# Patient Record
Sex: Female | Born: 1975
Health system: Southern US, Community
[De-identification: ages and names within clinical notes are randomized; demographics above are authoritative.]

## PROBLEM LIST (undated history)

## (undated) DIAGNOSIS — R102 Pelvic and perineal pain unspecified side: Secondary | ICD-10-CM

## (undated) DIAGNOSIS — R1031 Right lower quadrant pain: Secondary | ICD-10-CM

## (undated) DIAGNOSIS — N809 Endometriosis, unspecified: Secondary | ICD-10-CM

## (undated) DIAGNOSIS — F419 Anxiety disorder, unspecified: Secondary | ICD-10-CM

## (undated) DIAGNOSIS — T7840XA Allergy, unspecified, initial encounter: Secondary | ICD-10-CM

## (undated) DIAGNOSIS — N83209 Unspecified ovarian cyst, unspecified side: Secondary | ICD-10-CM

## (undated) DIAGNOSIS — K5909 Other constipation: Secondary | ICD-10-CM

## (undated) DIAGNOSIS — N6019 Diffuse cystic mastopathy of unspecified breast: Secondary | ICD-10-CM

## (undated) HISTORY — DX: Endometriosis, unspecified: N80.9

## (undated) HISTORY — PX: WISDOM TOOTH EXTRACTION: SHX21

## (undated) HISTORY — DX: Allergy, unspecified, initial encounter: T78.40XA

## (undated) HISTORY — PX: TONSILLECTOMY AND ADENOIDECTOMY: SUR1326

## (undated) HISTORY — DX: Other constipation: K59.09

## (undated) HISTORY — DX: Pelvic and perineal pain unspecified side: R10.20

## (undated) HISTORY — DX: Right lower quadrant pain: R10.31

## (undated) HISTORY — DX: Unspecified ovarian cyst, unspecified side: N83.209

## (undated) HISTORY — DX: Anxiety disorder, unspecified: F41.9

## (undated) HISTORY — DX: Pelvic and perineal pain: R10.2

---

## 2006-09-04 ENCOUNTER — Ambulatory Visit: Payer: Self-pay | Admitting: Surgery

## 2007-03-21 ENCOUNTER — Other Ambulatory Visit: Payer: Self-pay

## 2007-03-22 ENCOUNTER — Ambulatory Visit: Payer: Self-pay | Admitting: Emergency Medicine

## 2007-09-06 ENCOUNTER — Observation Stay: Payer: Self-pay

## 2007-10-18 ENCOUNTER — Emergency Department: Payer: Self-pay | Admitting: Emergency Medicine

## 2007-12-13 ENCOUNTER — Inpatient Hospital Stay: Payer: Self-pay | Admitting: Obstetrics and Gynecology

## 2010-05-31 ENCOUNTER — Ambulatory Visit: Payer: Self-pay

## 2011-01-17 ENCOUNTER — Other Ambulatory Visit: Payer: Self-pay | Admitting: Physician Assistant

## 2011-01-18 ENCOUNTER — Inpatient Hospital Stay: Payer: Self-pay | Admitting: Internal Medicine

## 2011-01-21 ENCOUNTER — Other Ambulatory Visit: Payer: Self-pay | Admitting: Physician Assistant

## 2011-01-25 ENCOUNTER — Other Ambulatory Visit: Payer: Self-pay | Admitting: Physician Assistant

## 2011-08-19 ENCOUNTER — Encounter: Payer: Self-pay | Admitting: Obstetrics and Gynecology

## 2011-09-30 ENCOUNTER — Encounter: Payer: Self-pay | Admitting: Maternal & Fetal Medicine

## 2011-11-12 ENCOUNTER — Encounter: Payer: Self-pay | Admitting: Cardiovascular Disease

## 2011-11-14 ENCOUNTER — Telehealth: Payer: Self-pay

## 2011-11-14 NOTE — Telephone Encounter (Signed)
Dr. Mariah Milling called pt at home to discuss palpitations.  She is 6 months pregnant being referred by OBGYN for c/o palpitations.  She told Dr. Mariah Milling she had these same palpitations with her last pregnancy and was caught on EKG. This was shown to be an atrial tachycardia. The episodes she is having now only last 10 mins at a time and do not occur frequently. The issue is if we prescribe a beta blocker, by the time she takes the med, the episode is over.  Dr. 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 Dr. Mariah Milling offered event/holter monitor but she would rather continue to monitor events and try to catch on an EKG. One option may be propanolol in the future if these episodes continue.  She will keep [place] updated on her situation.

## 2011-11-15 ENCOUNTER — Ambulatory Visit: Payer: Self-pay | Admitting: Cardiovascular Disease

## 2011-11-25 ENCOUNTER — Ambulatory Visit (INDEPENDENT_AMBULATORY_CARE_PROVIDER_SITE_OTHER): Payer: No Typology Code available for payment source

## 2011-11-25 VITALS — HR 101

## 2011-11-25 DIAGNOSIS — I4719 Other supraventricular tachycardia: Secondary | ICD-10-CM | POA: Insufficient documentation

## 2011-11-25 DIAGNOSIS — R002 Palpitations: Secondary | ICD-10-CM

## 2011-11-25 DIAGNOSIS — I471 Supraventricular tachycardia: Secondary | ICD-10-CM

## 2011-11-25 DIAGNOSIS — I498 Other specified cardiac arrhythmias: Secondary | ICD-10-CM

## 2011-11-25 MED ORDER — PROPRANOLOL HCL 10 MG PO TABS: 10.0000 mg | ORAL_TABLET | Freq: Three times a day (TID) | ORAL | Status: DC | PRN

## 2011-11-25 NOTE — Patient Instructions (Addendum)
Please take propranolol as needed for tachycardia, 10 mg, up to 20 mg as needed.

## 2011-11-25 NOTE — Progress Notes (Addendum)
Pt has hx palpitatiosn while preganant Dr. Mariah Milling advised pt to come in to office for EKG next time she felst tachycardic Pt says she felt sob earlier and felt pulse to be greater than 100 BPM Came in for EKG Dr. Mariah Milling to review   EKG shows sinus tachycardia. It is felt she has had episodic atrial tachycardia Worsening pregnancy

## 2012-01-11 ENCOUNTER — Observation Stay: Payer: Self-pay | Admitting: Obstetrics and Gynecology

## 2012-01-11 LAB — URINALYSIS, COMPLETE
Bacteria: NONE SEEN
Blood: NEGATIVE
Ketone: NEGATIVE
Leukocyte Esterase: NEGATIVE
Nitrite: NEGATIVE
RBC,UR: NONE SEEN /HPF (ref 0–5)
Specific Gravity: 1.009 (ref 1.003–1.030)
Squamous Epithelial: <1

## 2012-02-14 ENCOUNTER — Inpatient Hospital Stay: Payer: Self-pay | Admitting: Obstetrics and Gynecology

## 2012-02-14 LAB — CBC WITH DIFFERENTIAL/PLATELET
Basophil %: 0.2 %
Eosinophil %: 0.2 %
HCT: 35.1 % (ref 35.0–47.0)
HGB: 11.7 g/dL — ABNORMAL LOW (ref 12.0–16.0)
Lymphocyte #: 2.3 10*3/uL (ref 1.0–3.6)
MCH: 27.2 pg (ref 26.0–34.0)
MCV: 82 fL (ref 80–100)
Monocyte #: 0.6 x10 3/mm (ref 0.2–0.9)
Neutrophil #: 8.8 10*3/uL — ABNORMAL HIGH (ref 1.4–6.5)
Neutrophil %: 74.5 %
RBC: 4.29 10*6/uL (ref 3.80–5.20)

## 2012-02-15 LAB — HEMOGLOBIN: HGB: 11.7 g/dL — ABNORMAL LOW (ref 12.0–16.0)

## 2012-12-16 ENCOUNTER — Ambulatory Visit: Payer: Self-pay | Admitting: General Practice

## 2012-12-23 ENCOUNTER — Ambulatory Visit: Payer: Self-pay | Admitting: Internal Medicine

## 2013-01-14 LAB — HM PAP SMEAR: HM Pap smear: NEGATIVE

## 2013-02-17 ENCOUNTER — Ambulatory Visit: Payer: Self-pay | Admitting: Internal Medicine

## 2013-03-03 ENCOUNTER — Ambulatory Visit: Payer: Self-pay | Admitting: Internal Medicine

## 2013-09-09 ENCOUNTER — Ambulatory Visit: Payer: Self-pay

## 2013-09-09 LAB — RAPID STREP-A WITH REFLX: MICRO TEXT REPORT: NEGATIVE

## 2013-09-12 LAB — BETA STREP CULTURE(ARMC)

## 2014-03-30 ENCOUNTER — Ambulatory Visit: Payer: Self-pay | Admitting: Family Medicine

## 2014-08-02 ENCOUNTER — Encounter: Payer: Self-pay | Admitting: *Deleted

## 2014-09-06 NOTE — H&P (Signed)
L&D Evaluation:  History:   HPI 58 yowf [id number], estimated date of confinement 02/29/12, EGA [redacted] weeks    Presents with contractions    Patient's Medical History No Chronic Illness  GBS Bacteruria; SOB with Palpitations (w/u negative); h/o fetal macrosomia;GBS bacteruria    Patient's Surgical History none    Medications Pre Natal Vitamins    Allergies NKDA    Social History none  ER RN    Family History Non-Contributory   ROS:   ROS All systems were reviewed.  HEENT, CNS, GI, GU, Respiratory, CV, Renal and Musculoskeletal systems were found to be normal.   Exam:   Vital Signs stable    General no apparent distress    Mental Status clear    Chest clear    Heart normal sinus rhythm    Abdomen gravid, non-tender    Estimated Fetal Weight Average for gestational age, 8#    Back no CVAT    Edema 1+    Pelvic no external lesions, 6/90/-2/VTX    Mebranes Intact    FHT normal rate with no decels    Ucx regular    Skin dry    Lymph no lymphadenopathy    Other A+/Atb-/NR/RI/VI/HB-/HIV-/GBS-vaginal, GBS + Bacteruria   Impression:   Impression active labor   Plan:   Plan antibiotics for GBBS prophylaxis, SVD   Electronic Signatures: Alaysha Jefcoat, Alanda Slim (MD)  (Signed 18-Oct-13 04:21)  Authored: L&D Evaluation   Last Updated: 18-Oct-13 04:21 by Jeury Mcnab, Alanda Slim (MD)

## 2015-03-20 ENCOUNTER — Encounter: Payer: Self-pay | Admitting: Internal Medicine

## 2015-03-20 ENCOUNTER — Ambulatory Visit (INDEPENDENT_AMBULATORY_CARE_PROVIDER_SITE_OTHER)
Admission: RE | Admit: 2015-03-20 | Discharge: 2015-03-20 | Disposition: A | Discharge status: Home / Self Care | Payer: Commercial Managed Care - HMO | Source: Ambulatory Visit | Attending: Internal Medicine | Admitting: Internal Medicine

## 2015-03-20 ENCOUNTER — Encounter (INDEPENDENT_AMBULATORY_CARE_PROVIDER_SITE_OTHER): Payer: Self-pay

## 2015-03-20 ENCOUNTER — Ambulatory Visit (INDEPENDENT_AMBULATORY_CARE_PROVIDER_SITE_OTHER): Payer: Commercial Managed Care - HMO | Admitting: Internal Medicine

## 2015-03-20 VITALS — BP 116/68 | HR 93 | Temp 98.2°F | Ht 67.0 in | Wt 172.5 lb

## 2015-03-20 DIAGNOSIS — M546 Pain in thoracic spine: Secondary | ICD-10-CM | POA: Diagnosis not present

## 2015-03-20 DIAGNOSIS — K219 Gastro-esophageal reflux disease without esophagitis: Secondary | ICD-10-CM | POA: Diagnosis not present

## 2015-03-20 DIAGNOSIS — R1031 Right lower quadrant pain: Secondary | ICD-10-CM

## 2015-03-20 DIAGNOSIS — J302 Other seasonal allergic rhinitis: Secondary | ICD-10-CM | POA: Diagnosis not present

## 2015-03-20 NOTE — Assessment & Plan Note (Signed)
Continue Claritin prn 

## 2015-03-20 NOTE — Progress Notes (Signed)
Pre visit review using our clinic review tool, if applicable. No additional management support is needed unless otherwise documented below in the visit note. 

## 2015-03-20 NOTE — Assessment & Plan Note (Signed)
Try to identify triggers Continue Gaviscon OTC If persist, consider trying Zantac first

## 2015-03-20 NOTE — Progress Notes (Signed)
HPI  Pt presents to the clinic today to establish care. She has not had a PCP in many years but she has been getting GYN care from [facility].   She does c/o pain in right thoracic back. This started 2-3 years ago.  It has been intermittent during that time but she reports it has become more consistent. She describes the pain as a pinching. She denies numbness and tingling in the area. She denies any injury to the area. Nothing seems to make it worse. She had a adjustment by a chiropractor which did seem to make it better. She has seen occupation health in the past for the same. Ultrasound of RUQ was normal.    She also c/o RLQ pain. This has also been intermittent but seems to occur every month around the time of ovulation. She has seen Dr. Tama Headings. Pelvic ultrasound did not show any ovarian cyst.  Seasonal Allergies: Occur during the Fall and Spring. She takes Claritin and Zicam as needed.  GERD: Started many years ago. It occurs intermittently. She can not identify her triggers. She takes Gaviscon with good relief.  She does not get any relief with Tums.  Flu: 01/2015 Tetanus: She thinks it was 2009 Pap Smear: 2014- nromal Dentist: biannually   Past Medical History  Diagnosis Date  . Allergy     Current Outpatient Prescriptions  Medication Sig Dispense Refill  . Multiple Vitamin (MULTIVITAMIN) tablet Take 1 tablet by mouth daily.     No current facility-administered medications for this visit.    Allergies  Allergen Reactions  . Metrogel [Metronidazole] Rash    ??     Family History  Problem Relation Age of Onset  . Drug abuse Sister   . Drug abuse Brother   . Prostate cancer Maternal Grandfather   . Brain cancer Maternal Grandfather   . Stroke Maternal Grandfather     Social History   Social History  . Marital Status: Married    Spouse Name: N/A  . Number of Children: N/A  . Years of Education: N/A   Occupational History  . Not on file.   Social History  Main Topics  . Smoking status: Never Smoker   . Smokeless tobacco: Never Used  . Alcohol Use: 0.0 oz/week    0 Standard drinks or equivalent per week     Comment: occasional  . Drug Use: Not on file  . Sexual Activity: Not on file   Other Topics Concern  . Not on file   Social History Narrative  . No narrative on file    ROS:  Constitutional: Pt reports weight gain. Denies fever, malaise, fatigue, headache.  HEENT: Denies eye pain, eye redness, ear pain, ringing in the ears, wax buildup, runny nose, nasal congestion, bloody nose, or sore throat. Respiratory: Denies difficulty breathing, shortness of breath, cough or sputum production.   Cardiovascular: Denies chest pain, chest tightness, palpitations or swelling in the hands or feet.  Gastrointestinal: Pt reports constipation. Denies abdominal pain, bloating, diarrhea or blood in the stool.  GU: Denies frequency, urgency, pain with urination, blood in urine, odor or discharge. Musculoskeletal: Pt reports right side thoracic back pain. Denies decrease in range of motion, difficulty with gait, muscle pain or joint pain and swelling.  Skin: Pt reports a lump on her right flank. Denies redness, rashes, or ulcercations.  Neurological: Denies dizziness, difficulty with memory, difficulty with speech or problems with balance and coordination.  Psych: Denies anxiety, depression, SI/HI.  No other specific  complaints in a complete review of systems (except as listed in HPI above).  PE:  BP 116/68 mmHg  Pulse 93  Temp(Src) 98.2 F (36.8 C) (Oral)  Ht 5\' 7"  (1.702 m)  Wt 172 lb 8 oz (78.245 kg)  BMI 27.01 kg/m2  SpO2 99%  LMP 02/28/2015 Wt Readings from Last 3 Encounters:  03/20/15 172 lb 8 oz (78.245 kg)    General: Appears her stated age, well developed, well nourished in NAD.  Skin: Small, round raised pea sized papule noted on right flank. Cardiovascular: Normal rate and rhythm. S1,S2 noted.  No murmur, rubs or gallops noted.   Pulmonary/Chest: Normal effort and positive vesicular breath sounds. No respiratory distress. No wheezes, rales or ronchi noted.  Abdomen: Soft and nontender. Normal bowel sounds. No distention or masses noted. Liver, spleen and kidneys non palpable. Musculoskeletal: Normal flexion, extension and rotation of the spine. No bony tenderness noted. No pain with palpation of the right side ribs or intercostal areas. Pain not reproduced with palpation. Neurological: Alert and oriented.  Psychiatric: She does engage. She does seem anxious today.    Assessment and Plan:  Lump of skin:  Appears benign Will monitor for now  Right side thoracic back pain:  Seems MSK in origin Advised her to try Aleve or Ibuprofen Will check xray of thoracic spine Consider MRI if persist or worsens  RLQ pain:  Sounds like ovulation No need to repeat testing Discussed trying OCP to help ease the pain, she is not interested in that at this time Make an appt for your annual exam

## 2015-03-20 NOTE — Patient Instructions (Signed)

## 2015-03-21 ENCOUNTER — Telehealth: Payer: Self-pay | Admitting: Internal Medicine

## 2015-03-21 NOTE — Telephone Encounter (Signed)
Patient returned Caitlin Castillo's call about her x-ray results.

## 2015-05-23 ENCOUNTER — Ambulatory Visit (INDEPENDENT_AMBULATORY_CARE_PROVIDER_SITE_OTHER): Payer: Commercial Managed Care - HMO | Admitting: Obstetrics and Gynecology

## 2015-05-23 ENCOUNTER — Encounter: Payer: Self-pay | Admitting: Obstetrics and Gynecology

## 2015-05-23 VITALS — BP 127/78 | HR 68 | Ht 68.0 in | Wt 174.1 lb

## 2015-05-23 DIAGNOSIS — Z01419 Encounter for gynecological examination (general) (routine) without abnormal findings: Secondary | ICD-10-CM

## 2015-05-23 NOTE — Patient Instructions (Signed)
1.  No Pap smear today. 2.  Continue with healthy exercise to maintain normal BMI. 3.  Return in 1 year for physical. 4.  Screening lab work is ordered.

## 2015-05-23 NOTE — Progress Notes (Signed)
Patient ID: Caitlin Castillo, female   DOB: July 20, 1975, 40 y.o.   MRN: [id number] ANNUAL PREVENTATIVE CARE GYN  ENCOUNTER NOTE  Subjective:       Caitlin Castillo is a 40 y.o. [id number] female here for a routine annual gynecologic exam.  Current complaints: 1.  none    Gynecologic History Patient's last menstrual period was 05/15/2015 (approximate). Contraception: vasectomy Last Pap: 12/2012 neg/neg. Results were: normal Last mammogram: n/a   Obstetric History OB History  Gravida Para Term Preterm AB SAB TAB Ectopic Multiple Living  5 4 4       5     # Outcome Date GA Lbr Len/2nd Weight Sex Delivery Anes PTL Lv  5 Term 2013     Vag-Spont   Y  4 Term 2009     Vag-Spont   Y  3 Term 2005     VBAC   Y  2 Term 2003     Vag-Spont   Y  1 Gravida 1999     Vag-Spont   Y      Past Medical History  Diagnosis Date  . Allergy   . Ovarian cyst   . Anxiety     mild  . Endometriosis   . Pelvic pain in female   . Right lower quadrant pain   . Chronic constipation     Past Surgical History  Procedure Laterality Date  . Tonsillectomy and adenoidectomy      No current outpatient prescriptions on file prior to visit.   No current facility-administered medications on file prior to visit.    Allergies  Allergen Reactions  . Metrogel [Metronidazole] Rash    ??     Social History   Social History  . Marital Status: Married    Spouse Name: N/A  . Number of Children: N/A  . Years of Education: N/A   Occupational History  . Not on file.   Social History Main Topics  . Smoking status: Never Smoker   . Smokeless tobacco: Never Used  . Alcohol Use: 0.0 oz/week    0 Standard drinks or equivalent per week     Comment: occasional  . Drug Use: No  . Sexual Activity: Yes    Birth Control/ Protection: Surgical     Comment: vacetomy   Other Topics Concern  . Not on file   Social History Narrative    Family History  Problem Relation Age of Onset  . Prostate cancer Maternal  Grandfather   . Brain cancer Maternal Grandfather   . Stroke Maternal Grandfather   . Drug abuse Maternal Aunt   . Drug abuse Maternal Uncle   . Breast cancer Maternal Uncle   . Endometriosis Mother   . Colon cancer Neg Hx   . Ovarian cancer Neg Hx   . Cancer Neg Hx   . Diabetes Neg Hx   . Heart disease Neg Hx     The following portions of the patient's history were reviewed and updated as appropriate: allergies, current medications, past family history, past medical history, past social history, past surgical history and problem list.  Review of Systems ROS Review of Systems - General ROS: negative for - chills, fatigue, fever, hot flashes, night sweats, weight gain or weight loss Psychological ROS: negative for - anxiety, decreased libido, depression, mood swings, physical abuse or sexual abuse Ophthalmic ROS: negative for - blurry vision, eye pain or loss of vision ENT ROS: negative for - headaches, hearing change, visual changes or vocal  changes Allergy and Immunology ROS: negative for - hives, itchy/watery eyes or seasonal allergies Hematological and Lymphatic ROS: negative for - bleeding problems, bruising, swollen lymph nodes or weight loss Endocrine ROS: negative for - galactorrhea, hair pattern changes, hot flashes, malaise/lethargy, mood swings, palpitations, polydipsia/polyuria, skin changes, temperature intolerance or unexpected weight changes Breast ROS: negative for - new or changing breast lumps or nipple discharge Respiratory ROS: negative for - cough or shortness of breath Cardiovascular ROS: negative for - chest pain, irregular heartbeat, palpitations or shortness of breath Gastrointestinal ROS: no abdominal pain, change in bowel habits, or black or bloody stools Genito-Urinary ROS: no dysuria, trouble voiding, or hematuria Musculoskeletal ROS: negative for - joint pain or joint stiffness Neurological ROS: negative for - bowel and bladder control  changes Dermatological ROS: negative for rash and skin lesion changes   Objective:   BP 127/78 mmHg  Pulse 68  Ht 5\' 8"  (1.727 m)  Wt 174 lb 1.6 oz (78.971 kg)  BMI 26.48 kg/m2  LMP 05/15/2015 (Approximate) CONSTITUTIONAL: Well-developed, well-nourished female in no acute distress.  PSYCHIATRIC: Normal mood and affect. Normal behavior. Normal judgment and thought content. [place]: Alert and oriented to person, place, and time. Normal muscle tone coordination. No cranial nerve deficit noted. HENT:  Normocephalic, atraumatic, External right and left ear normal. Oropharynx is clear and moist EYES: Conjunctivae and EOM are normal. Pupils are equal, round, and reactive to light. No scleral icterus.  NECK: Normal range of motion, supple, no masses.  Normal thyroid.  SKIN: Skin is warm and dry. No rash noted. Not diaphoretic. No erythema. No pallor. CARDIOVASCULAR: Normal heart rate noted, regular rhythm, no murmur. RESPIRATORY: Clear to auscultation bilaterally. Effort and breath sounds normal, no problems with respiration noted. BREASTS: Symmetric in size. No masses, skin changes, nipple drainage, or lymphadenopathy. ABDOMEN: Soft, normal bowel sounds, no distention noted.  No tenderness, rebound or guarding.  BLADDER: Normal PELVIC:  External Genitalia: Normal  BUS: Normal  Vagina: Normal   Cervix: Normal  Uterus: Normal  Adnexa: Normal  RV: External Exam NormaI  MUSCULOSKELETAL: Normal range of motion. No tenderness.  No cyanosis, clubbing, or edema.  2+ distal pulses. LYMPHATIC: No Axillary, Supraclavicular, or Inguinal Adenopathy.    Assessment:   Annual gynecologic examination 40 y.o. Contraception: vasectomy bmi and Normal BMI- 26 Problem List Items Addressed This Visit    None      Plan:  Pap: Not needed Mammogram: Not Indicated Stool Guaiac Testing:  Not Indicated Labs: tsh vit d fbs a1c lipid Routine preventative health maintenance measures emphasized:  Exercise/Diet/Weight control, Tobacco Warnings and Alcohol/Substance use risks Return to [place], CMA  Brayton Mars, MD  Note: This dictation was prepared with Dragon dictation along with smaller phrase technology. Any transcriptional errors that result from this process are unintentional. '

## 2015-06-26 ENCOUNTER — Ambulatory Visit
Admission: RE | Admit: 2015-06-26 | Discharge: 2015-06-26 | Disposition: A | Discharge status: Home / Self Care | Payer: Commercial Managed Care - HMO | Source: Ambulatory Visit | Attending: Medical | Admitting: Medical

## 2015-06-26 ENCOUNTER — Encounter: Payer: Self-pay | Admitting: Physician Assistant

## 2015-06-26 ENCOUNTER — Other Ambulatory Visit: Payer: Self-pay | Admitting: Medical

## 2015-06-26 ENCOUNTER — Ambulatory Visit: Payer: Self-pay | Admitting: Family

## 2015-06-26 VITALS — BP 110/70

## 2015-06-26 DIAGNOSIS — M25571 Pain in right ankle and joints of right foot: Secondary | ICD-10-CM

## 2015-06-26 DIAGNOSIS — M79671 Pain in right foot: Secondary | ICD-10-CM | POA: Insufficient documentation

## 2015-06-27 NOTE — Progress Notes (Signed)
See note in paper chart by Heather Ratcliffe, PAC  

## 2015-07-31 ENCOUNTER — Telehealth: Payer: 59 | Admitting: Family

## 2015-07-31 DIAGNOSIS — A499 Bacterial infection, unspecified: Secondary | ICD-10-CM

## 2015-07-31 DIAGNOSIS — H109 Unspecified conjunctivitis: Secondary | ICD-10-CM

## 2015-07-31 DIAGNOSIS — H1089 Other conjunctivitis: Secondary | ICD-10-CM

## 2015-07-31 MED ORDER — POLYMYXIN B-TRIMETHOPRIM 10000-0.1 UNIT/ML-% OP SOLN: 1.0000 [drp] | OPHTHALMIC | Status: DC

## 2015-07-31 NOTE — Progress Notes (Signed)

## 2015-07-31 NOTE — Progress Notes (Signed)
Disregard

## 2015-09-09 ENCOUNTER — Emergency Department: Payer: 59

## 2015-09-09 ENCOUNTER — Encounter: Payer: Self-pay | Admitting: Emergency Medicine

## 2015-09-09 ENCOUNTER — Emergency Department
Admission: EM | Admit: 2015-09-09 | Discharge: 2015-09-09 | Disposition: A | Discharge status: Home / Self Care | Payer: 59 | Attending: Emergency Medicine | Admitting: Emergency Medicine

## 2015-09-09 DIAGNOSIS — Y999 Unspecified external cause status: Secondary | ICD-10-CM | POA: Diagnosis not present

## 2015-09-09 DIAGNOSIS — X58XXXA Exposure to other specified factors, initial encounter: Secondary | ICD-10-CM | POA: Insufficient documentation

## 2015-09-09 DIAGNOSIS — Y929 Unspecified place or not applicable: Secondary | ICD-10-CM | POA: Diagnosis not present

## 2015-09-09 DIAGNOSIS — Y9389 Activity, other specified: Secondary | ICD-10-CM | POA: Insufficient documentation

## 2015-09-09 DIAGNOSIS — S63614A Unspecified sprain of right ring finger, initial encounter: Secondary | ICD-10-CM | POA: Diagnosis not present

## 2015-09-09 DIAGNOSIS — M7989 Other specified soft tissue disorders: Secondary | ICD-10-CM | POA: Diagnosis not present

## 2015-09-09 DIAGNOSIS — S6991XA Unspecified injury of right wrist, hand and finger(s), initial encounter: Secondary | ICD-10-CM | POA: Diagnosis present

## 2015-09-09 MED ORDER — IBUPROFEN 800 MG PO TABS: 800.0000 mg | ORAL_TABLET | Freq: Three times a day (TID) | ORAL | Status: DC | PRN

## 2015-09-09 MED ORDER — TRAMADOL HCL 50 MG PO TABS: 50.0000 mg | ORAL_TABLET | Freq: Four times a day (QID) | ORAL | Status: DC | PRN

## 2015-09-09 NOTE — ED Provider Notes (Signed)
[facility] Emergency Department Provider Note   ____________________________________________  Time seen: Approximately 7:03 PM  I have reviewed the triage vital signs and the nursing notes.   HISTORY  Chief Complaint Hand Injury     HPI Caitlin Castillo is a 40 y.o. female patient complaining of pain to the third and fourth digit right dominant hand. Patient states she was jerked suddenly by her horse while holding leading rope. Incident occurred 3 hours ago. Patient continued to have pain, edema, and decreased range of motion with extension. No palliative measures taken for this complaint. She is rating the pain as a 6/10. Patient described a pain as "sharp".   Past Medical History  Diagnosis Date  . Allergy   . Ovarian cyst   . Anxiety     mild  . Endometriosis   . Pelvic pain in female   . Right lower quadrant pain   . Chronic constipation     Patient Active Problem List   Diagnosis Date Noted  . Seasonal allergies 03/20/2015  . GERD (gastroesophageal reflux disease) 03/20/2015  . Atrial tachycardia ([place]) 11/25/2011    Past Surgical History  Procedure Laterality Date  . Tonsillectomy and adenoidectomy      Current Outpatient Rx  Name  Route  Sig  Dispense  Refill  . ibuprofen (ADVIL,MOTRIN) 800 MG tablet   Oral   Take 1 tablet (800 mg total) by mouth every 8 (eight) hours as needed.   30 tablet   0   . EXPIRED: propranolol (INDERAL) 10 MG tablet   Oral   Take 1 tablet (10 mg total) by mouth 3 (three) times daily as needed.   90 tablet   3   . traMADol (ULTRAM) 50 MG tablet   Oral   Take 1 tablet (50 mg total) by mouth every 6 (six) hours as needed for moderate pain.   12 tablet   0   . trimethoprim-polymyxin b (POLYTRIM) ophthalmic solution   Both Eyes   Place 1 drop into both eyes every 4 (four) hours.   10 mL   0     Allergies Citrus; Shellfish allergy; Tape; and Metrogel  Family History  Problem Relation Age  of Onset  . Prostate cancer Maternal Grandfather   . Brain cancer Maternal Grandfather   . Stroke Maternal Grandfather   . Drug abuse Maternal Aunt   . Drug abuse Maternal Uncle   . Breast cancer Maternal Uncle   . Endometriosis Mother   . Colon cancer Neg Hx   . Ovarian cancer Neg Hx   . Cancer Neg Hx   . Diabetes Neg Hx   . Heart disease Neg Hx     Social History Social History  Substance Use Topics  . Smoking status: Never Smoker   . Smokeless tobacco: Never Used  . Alcohol Use: 0.0 oz/week    0 Standard drinks or equivalent per week     Comment: occasional    Review of Systems Constitutional: No fever/chills Eyes: No visual changes. ENT: No sore throat. Cardiovascular: Denies chest pain. Respiratory: Denies shortness of breath. Gastrointestinal: No abdominal pain.  No nausea, no vomiting.  No diarrhea.  No constipation. Genitourinary: Negative for dysuria. Musculoskeletal:Pain third and fourth digit right hand. Skin: Negative for rash. Neurological: Negative for headaches, focal weakness or numbness. Psychiatric: Anxiety ____________________________________________   PHYSICAL EXAM:  VITAL SIGNS: ED Triage Vitals  Enc Vitals Group     BP 09/09/15 1844 122/77 mmHg  Pulse Rate 09/09/15 1844 100     Resp 09/09/15 1844 20     Temp 09/09/15 1844 98.6 F (37 C)     Temp Source 09/09/15 1844 Oral     SpO2 09/09/15 1844 100 %     Weight 09/09/15 1844 165 lb (74.844 kg)     Height 09/09/15 1844 5\' 8"  (1.727 m)     Head Cir --      Peak Flow --      Pain Score 09/09/15 1845 6     Pain Loc --      Pain Edu? --      Excl. in [place]? --     Constitutional: Alert and oriented. Well appearing and in no acute distress. Eyes: Conjunctivae are normal. PERRL. EOMI. Head: Atraumatic. Nose: No congestion/rhinnorhea. Mouth/Throat: Mucous membranes are moist.  Oropharynx non-erythematous. Neck: No stridor.  No cervical spine tenderness to  palpation. Hematological/Lymphatic/Immunilogical: No cervical lymphadenopathy. Cardiovascular: Normal rate, regular rhythm. Grossly normal heart sounds.  Good peripheral circulation. Respiratory: Normal respiratory effort.  No retractions. Lungs CTAB. Gastrointestinal: Soft and nontender. No distention. No abdominal bruits. No CVA tenderness. Musculoskeletal: No deformities to the fourth digit right hand. Patient has some moderate edema and holds the finger and affect slightly flexed position.  Neurologic:  Normal speech and language. No gross focal neurologic deficits are appreciated. No gait instability. Skin:  Skin is warm, dry and intact. No rash noted. Psychiatric: Mood and affect are normal. Speech and behavior are normal.  ____________________________________________   LABS (all labs ordered are listed, but only abnormal results are displayed)  Labs Reviewed - No data to display ____________________________________________  EKG   ____________________________________________  RADIOLOGY  No acute findings on x-ray. ____________________________________________   PROCEDURES  Procedure(s) performed: None  Critical Care performed: No  ____________________________________________   INITIAL IMPRESSION / ASSESSMENT AND PLAN / ED COURSE  Pertinent labs & imaging results that were available during my care of the patient were reviewed by me and considered in my medical decision making (see chart for details).  Pain right finger. Discussed x-ray finding with patient. She given discharge care instructions. Patient placed in a finger splint. Patient given a prescription for ibuprofen and tramadol. Patient advised to follow-up with orthopedist no improvement in 2 days. ____________________________________________   FINAL CLINICAL IMPRESSION(S) / ED DIAGNOSES  Final diagnoses:  Sprain of right ring finger, initial encounter      NEW MEDICATIONS STARTED DURING THIS  VISIT:  New Prescriptions   IBUPROFEN (ADVIL,MOTRIN) 800 MG TABLET    Take 1 tablet (800 mg total) by mouth every 8 (eight) hours as needed.   TRAMADOL (ULTRAM) 50 MG TABLET    Take 1 tablet (50 mg total) by mouth every 6 (six) hours as needed for moderate pain.     Note:  This document was prepared using Dragon voice recognition software and may include unintentional dictation errors.    Sable Feil, PA-C 09/09/15 1948  Daymon Larsen, MD 09/09/15 2131

## 2015-09-09 NOTE — ED Notes (Signed)
Injured R hand while holding lead rope

## 2015-09-09 NOTE — Discharge Instructions (Signed)
Wear splint for 2-3 days. Finger Sprain A finger sprain happens when the bands of tissue that hold the finger bones together (ligaments) stretch too much and tear. HOME CARE  Keep your injured finger raised (elevated) when possible.  Put ice on the injured area, twice a day, for 2 to 3 days.  Put ice in a plastic bag.  Place a towel between your skin and the bag.  Leave the ice on for 15 minutes.  Only take medicine as told by your doctor.  Do not wear rings on the injured finger.  Protect your finger until pain and stiffness go away (usually 3 to 4 weeks).  Do not get your cast or splint to get wet. Cover your cast or splint with a plastic bag when you shower or bathe. Do not swim.  Your doctor may suggest special exercises for you to do. These exercises will help keep or stop stiffness from happening. GET HELP RIGHT AWAY IF:  Your cast or splint gets damaged.  Your pain gets worse, not better. MAKE SURE YOU:  Understand these instructions.  Will watch your condition.  Will get help right away if you are not doing well or get worse.   This information is not intended to replace advice given to you by your health care provider. Make sure you discuss any questions you have with your health care provider.   Document Released: 05/18/2010 Document Revised: 07/08/2011 Document Reviewed: 12/17/2010 Elsevier Interactive Patient Education [employer].

## 2015-09-19 DIAGNOSIS — M79644 Pain in right finger(s): Secondary | ICD-10-CM | POA: Diagnosis not present

## 2015-10-19 ENCOUNTER — Encounter: Payer: Self-pay | Admitting: Physician Assistant

## 2015-10-19 ENCOUNTER — Ambulatory Visit: Payer: Self-pay | Admitting: Physician Assistant

## 2015-10-19 VITALS — BP 110/70 | HR 76 | Temp 98.1°F

## 2015-10-19 DIAGNOSIS — J351 Hypertrophy of tonsils: Secondary | ICD-10-CM

## 2015-10-19 MED ORDER — AMOXICILLIN-POT CLAVULANATE 875-125 MG PO TABS: 1.0000 | ORAL_TABLET | Freq: Two times a day (BID) | ORAL | Status: DC

## 2015-10-19 MED ORDER — DEXAMETHASONE SODIUM PHOSPHATE 10 MG/ML IJ SOLN
10.0000 mg | Freq: Once | INTRAMUSCULAR | Status: AC
  Administered 2015-10-19: 10 mg via INTRAMUSCULAR

## 2015-10-19 MED ORDER — FLUCONAZOLE 150 MG PO TABS: ORAL_TABLET | ORAL | Status: DC

## 2015-10-19 NOTE — Progress Notes (Signed)
S: c/o sore throat, had her tonsils out as a child but had a "tag" left, area is usually small and doesn't cause problems, when was little she got an abscess in that area, feels the same today, neck and glands hurt, hurts to swallow, feels like its pressing on her tongue so she can't speak well, called ENT and can't be seen until tomorrow  O: vitals wnl, nad, throat with enlarge area of tissue swollen into ball on left side , neck supple + lymph on upper cervical chain b/l, lungs c t a, cv rrr  A: tonsillar enlargement with ? Abscess  P: augmentin 875mg  bid x 10d, decadron 10mg  IM, diflucan for yeast prevention, f/u with ENT for appt tomorrow, if worsening go to ER

## 2015-10-20 DIAGNOSIS — J039 Acute tonsillitis, unspecified: Secondary | ICD-10-CM | POA: Diagnosis not present

## 2016-05-21 NOTE — Progress Notes (Signed)
Patient ID: Caitlin Castillo, female   DOB: [birth year], 41 y.o.   MRN: [id number] ANNUAL PREVENTATIVE CARE GYN  ENCOUNTER NOTE  Subjective:       Caitlin Castillo is a 41 y.o. [id number] female here for a routine annual gynecologic exam.  Current complaints: 1.  Wt gain  Bowel and bladder function is normal. Patient is to get screening labs today.   Gynecologic History lmp 04/2016 Contraception: vasectomy Last Pap: 12/2012 neg/neg. Results were: normal Last mammogram: n/a   Obstetric History OB History  Gravida Para Term Preterm AB Living  6 4 4  0 0 5  SAB TAB Ectopic Multiple Live Births  0 0 0   5    # Outcome Date GA Lbr Len/2nd Weight Sex Delivery Anes PTL Lv  6 Gravida           5 Term 2013     Vag-Spont   LIV  4 Term 2009     Vag-Spont   LIV  3 Term 2005     VBAC   LIV  2 Term 2003     Vag-Spont   LIV  1 Gravida 1999     Vag-Spont   LIV      Past Medical History:  Diagnosis Date  . Allergy   . Anxiety    mild  . Chronic constipation   . Endometriosis   . Ovarian cyst   . Pelvic pain in female   . Right lower quadrant pain     Past Surgical History:  Procedure Laterality Date  . TONSILLECTOMY AND ADENOIDECTOMY      Current Outpatient Prescriptions on File Prior to Visit  Medication Sig Dispense Refill  . amoxicillin-clavulanate (AUGMENTIN) 875-125 MG tablet Take 1 tablet by mouth 2 (two) times daily. 20 tablet 0  . fluconazole (DIFLUCAN) 150 MG tablet Take one now and one in a week 2 tablet 0  . ibuprofen (ADVIL,MOTRIN) 800 MG tablet Take 1 tablet (800 mg total) by mouth every 8 (eight) hours as needed. (Patient not taking: Reported on 10/19/2015) 30 tablet 0  . propranolol (INDERAL) 10 MG tablet Take 1 tablet (10 mg total) by mouth 3 (three) times daily as needed. 90 tablet 3  . traMADol (ULTRAM) 50 MG tablet Take 1 tablet (50 mg total) by mouth every 6 (six) hours as needed for moderate pain. 12 tablet 0  . trimethoprim-polymyxin b (POLYTRIM) ophthalmic  solution Place 1 drop into both eyes every 4 (four) hours. (Patient not taking: Reported on 10/19/2015) 10 mL 0   No current facility-administered medications on file prior to visit.     Allergies  Allergen Reactions  . Citrus   . Shellfish Allergy   . Tape   . Metrogel [Metronidazole] Rash    ??     Social History   Social History  . Marital status: Married    Spouse name: N/A  . Number of children: N/A  . Years of education: N/A   Occupational History  . Not on file.   Social History Main Topics  . Smoking status: Never Smoker  . Smokeless tobacco: Never Used  . Alcohol use 0.0 oz/week     Comment: occasional  . Drug use: No  . Sexual activity: Yes    Birth control/ protection: Surgical     Comment: vacetomy   Other Topics Concern  . Not on file   Social History Narrative   ** Merged History Encounter **  Family History  Problem Relation Age of Onset  . Prostate cancer Maternal Grandfather   . Brain cancer Maternal Grandfather   . Stroke Maternal Grandfather   . Drug abuse Maternal Aunt   . Drug abuse Maternal Uncle   . Breast cancer Maternal Uncle   . Endometriosis Mother   . Colon cancer Neg Hx   . Ovarian cancer Neg Hx   . Cancer Neg Hx   . Diabetes Neg Hx   . Heart disease Neg Hx     The following portions of the patient's history were reviewed and updated as appropriate: allergies, current medications, past family history, past medical history, past social history, past surgical history and problem list.  Review of Systems ROS Review of Systems - General ROS: negative for - chills, fatigue, fever, hot flashes, night sweats, weight gain or weight loss Psychological ROS: negative for - anxiety, decreased libido, depression, mood swings, physical abuse or sexual abuse Ophthalmic ROS: negative for - blurry vision, eye pain or loss of vision ENT ROS: negative for - headaches, hearing change, visual changes or vocal changes Allergy and  Immunology ROS: negative for - hives, itchy/watery eyes or seasonal allergies Hematological and Lymphatic ROS: negative for - bleeding problems, bruising, swollen lymph nodes or weight loss Endocrine ROS: negative for - galactorrhea, hair pattern changes, hot flashes, malaise/lethargy, mood swings, palpitations, polydipsia/polyuria, skin changes, temperature intolerance or unexpected weight changes Breast ROS: negative for - new or changing breast lumps or nipple discharge Respiratory ROS: negative for - cough or shortness of breath Cardiovascular ROS: negative for - chest pain, irregular heartbeat, palpitations or shortness of breath Gastrointestinal ROS: no abdominal pain, change in bowel habits, or black or bloody stools Genito-Urinary ROS: no dysuria, trouble voiding, or hematuria Musculoskeletal ROS: negative for - joint pain or joint stiffness Neurological ROS: negative for - bowel and bladder control changes Dermatological ROS: negative for rash and skin lesion changes   Objective:    BP 119/82   Pulse 87   Ht 5\' 8"  (1.727 m)   Wt 178 lb 11.2 oz (81.1 kg)   LMP 05/18/2016 (Exact Date)   Breastfeeding? No   BMI 27.17 kg/m  CONSTITUTIONAL: Well-developed, well-nourished female in no acute distress.  PSYCHIATRIC: Normal mood and affect. Normal behavior. Normal judgment and thought content. [place]: Alert and oriented to person, place, and time. Normal muscle tone coordination. No cranial nerve deficit noted. HENT:  Normocephalic, atraumatic, External right and left ear normal. Oropharynx is clear and moist EYES: Conjunctivae and EOM are normal. Pupils are equal, round, and reactive to light. No scleral icterus.  NECK: Normal range of motion, supple, no masses.  Normal thyroid.  SKIN: Skin is warm and dry. No rash noted. Not diaphoretic. No erythema. No pallor. CARDIOVASCULAR: Normal heart rate noted, regular rhythm, no murmur. RESPIRATORY: Clear to auscultation bilaterally. Effort  and breath sounds normal, no problems with respiration noted. BREASTS: Symmetric in size. No masses, skin changes, nipple drainage, or lymphadenopathy. ABDOMEN: Soft, normal bowel sounds, no distention noted.  No tenderness, rebound or guarding.  BLADDER: Normal PELVIC:  External Genitalia: Normal  BUS: Normal  Vagina: Normal   Cervix: Normal; No lesions; parous  Uterus: Normal; midplane, normal size and shape, mobile, nontender 6  Adnexa: Normal  RV: External Exam NormaI  MUSCULOSKELETAL: Normal range of motion. No tenderness.  No cyanosis, clubbing, or edema.  2+ distal pulses. LYMPHATIC: No Axillary, Supraclavicular, or Inguinal Adenopathy.    Assessment:   Annual gynecologic examination 40  y.o. Contraception: vasectomy bmi and Normal BMI- 27 Problem List Items Addressed This Visit    None    Visit Diagnoses    Well woman exam with routine gynecological exam    -  Primary      Plan:  Pap:pap w/hpv Mammogram: Not Indicated Stool Guaiac Testing:  Not Indicated Labs: tsh vit d fbs a1c lipid Routine preventative health maintenance measures emphasized: Exercise/Diet/Weight control, Tobacco Warnings and Alcohol/Substance use risks Return to [place], CMA  Brayton Mars, MD   Note: This dictation was prepared with Dragon dictation along with smaller phrase technology. Any transcriptional errors that result from this process are unintentional. '

## 2016-05-24 ENCOUNTER — Encounter: Payer: Self-pay | Admitting: Physician Assistant

## 2016-05-24 ENCOUNTER — Ambulatory Visit: Payer: Self-pay | Admitting: Physician Assistant

## 2016-05-24 VITALS — BP 100/80 | HR 80 | Temp 98.3°F

## 2016-05-24 DIAGNOSIS — R21 Rash and other nonspecific skin eruption: Secondary | ICD-10-CM

## 2016-05-24 MED ORDER — MUPIROCIN 2 % EX OINT: 1.0000 "application " | TOPICAL_OINTMENT | Freq: Two times a day (BID) | CUTANEOUS | 3 refills | Status: DC

## 2016-05-24 NOTE — Progress Notes (Signed)
S: c/o rash at nose, area has been dry and itcy for over a month, used essential oils and some neosporin without relief, no fever/chills Son has ring worm  O: vitals wnl, nad, skin around nose is red and irritated, broken skin at anterior nares, no drainage, no vesicles or honey colored lesions, n/v intact  A: rash  P: bactroban ointment

## 2016-05-28 ENCOUNTER — Ambulatory Visit (INDEPENDENT_AMBULATORY_CARE_PROVIDER_SITE_OTHER): Payer: Commercial Managed Care - HMO | Admitting: Obstetrics and Gynecology

## 2016-05-28 ENCOUNTER — Encounter: Payer: Self-pay | Admitting: Obstetrics and Gynecology

## 2016-05-28 VITALS — BP 119/82 | HR 87 | Ht 68.0 in | Wt 178.7 lb

## 2016-05-28 DIAGNOSIS — R638 Other symptoms and signs concerning food and fluid intake: Secondary | ICD-10-CM | POA: Diagnosis not present

## 2016-05-28 DIAGNOSIS — Z01419 Encounter for gynecological examination (general) (routine) without abnormal findings: Secondary | ICD-10-CM | POA: Diagnosis not present

## 2016-05-28 NOTE — Patient Instructions (Signed)
1. Pap smear is completed 2.Self breast awareness is encouraged 3. Screening labs are ordered 4. Continue with healthy eating and exercise with 5 pound weight loss over the year 5. Return in 1 year for annual exam  Health Maintenance, Female Introduction Adopting a healthy lifestyle and getting preventive care can go a long way to promote health and wellness. Talk with your health care provider about what schedule of regular examinations is right for you. This is a good chance for you to check in with your provider about disease prevention and staying healthy. In between checkups, there are plenty of things you can do on your own. Experts have done a lot of research about which lifestyle changes and preventive measures are most likely to keep you healthy. Ask your health care provider for more information. Weight and diet Eat a healthy diet  Be sure to include plenty of vegetables, fruits, low-fat dairy products, and lean protein.  Do not eat a lot of foods high in solid fats, added sugars, or salt.  Get regular exercise. This is one of the most important things you can do for your health.  Most adults should exercise for at least 150 minutes each week. The exercise should increase your heart rate and make you sweat (moderate-intensity exercise).  Most adults should also do strengthening exercises at least twice a week. This is in addition to the moderate-intensity exercise. Maintain a healthy weight  Body mass index (BMI) is a measurement that can be used to identify possible weight problems. It estimates body fat based on height and weight. Your health care provider can help determine your BMI and help you achieve or maintain a healthy weight.  For females 80 years of age and older:  A BMI below 18.5 is considered underweight.  A BMI of 18.5 to 24.9 is normal.  A BMI of 25 to 29.9 is considered overweight.  A BMI of 30 and above is considered obese. Watch levels of cholesterol  and blood lipids  You should start having your blood tested for lipids and cholesterol at 39 years of age, then have this test every 5 years.  You may need to have your cholesterol levels checked more often if:  Your lipid or cholesterol levels are high.  You are older than 39 years of age.  You are at high risk for heart disease. Cancer screening Lung Cancer  Lung cancer screening is recommended for adults 58-48 years old who are at high risk for lung cancer because of a history of smoking.  A yearly low-dose CT scan of the lungs is recommended for people who:  Currently smoke.  Have quit within the past 15 years.  Have at least a 30-pack-year history of smoking. A pack year is smoking an average of one pack of cigarettes a day for 1 year.  Yearly screening should continue until it has been 15 years since you quit.  Yearly screening should stop if you develop a health problem that would prevent you from having lung cancer treatment. Breast Cancer  Practice breast self-awareness. This means understanding how your breasts normally appear and feel.  It also means doing regular breast self-exams. Let your health care provider know about any changes, no matter how small.  If you are in your 20s or 30s, you should have a clinical breast exam (CBE) by a health care provider every 1-3 years as part of a regular health exam.  If you are 67 or older, have a CBE every  year. Also consider having a breast X-ray (mammogram) every year.  If you have a family history of breast cancer, talk to your health care provider about genetic screening.  If you are at high risk for breast cancer, talk to your health care provider about having an MRI and a mammogram every year.  Breast cancer gene (BRCA) assessment is recommended for women who have family members with BRCA-related cancers. BRCA-related cancers include:  Breast.  Ovarian.  Tubal.  Peritoneal cancers.  Results of the assessment  will determine the need for genetic counseling and BRCA1 and BRCA2 testing. Cervical Cancer  Your health care provider may recommend that you be screened regularly for cancer of the pelvic organs (ovaries, uterus, and vagina). This screening involves a pelvic examination, including checking for microscopic changes to the surface of your cervix (Pap test). You may be encouraged to have this screening done every 3 years, beginning at age 34.  For women ages 94-65, health care providers may recommend pelvic exams and Pap testing every 3 years, or they may recommend the Pap and pelvic exam, combined with testing for human papilloma virus (HPV), every 5 years. Some types of HPV increase your risk of cervical cancer. Testing for HPV may also be done on women of any age with unclear Pap test results.  Other health care providers may not recommend any screening for nonpregnant women who are considered low risk for pelvic cancer and who do not have symptoms. Ask your health care provider if a screening pelvic exam is right for you.  If you have had past treatment for cervical cancer or a condition that could lead to cancer, you need Pap tests and screening for cancer for at least 20 years after your treatment. If Pap tests have been discontinued, your risk factors (such as having a new sexual partner) need to be reassessed to determine if screening should resume. Some women have medical problems that increase the chance of getting cervical cancer. In these cases, your health care provider may recommend more frequent screening and Pap tests. Colorectal Cancer  This type of cancer can be detected and often prevented.  Routine colorectal cancer screening usually begins at 39 years of age and continues through 39 years of age.  Your health care provider may recommend screening at an earlier age if you have risk factors for colon cancer.  Your health care provider may also recommend using home test kits to check  for hidden blood in the stool.  A small camera at the end of a tube can be used to examine your colon directly (sigmoidoscopy or colonoscopy). This is done to check for the earliest forms of colorectal cancer.  Routine screening usually begins at age 38.  Direct examination of the colon should be repeated every 5-10 years through 39 years of age. However, you may need to be screened more often if early forms of precancerous polyps or small growths are found. Skin Cancer  Check your skin from head to toe regularly.  Tell your health care provider about any new moles or changes in moles, especially if there is a change in a mole's shape or color.  Also tell your health care provider if you have a mole that is larger than the size of a pencil eraser.  Always use sunscreen. Apply sunscreen liberally and repeatedly throughout the day.  Protect yourself by wearing long sleeves, pants, a wide-brimmed hat, and sunglasses whenever you are outside. Heart disease, diabetes, and high blood pressure  High blood pressure causes heart disease and increases the risk of stroke. High blood pressure is more likely to develop in:  People who have blood pressure in the high end of the normal range (130-139/85-89 mm Hg).  People who are overweight or obese.  People who are African American.  If you are 92-47 years of age, have your blood pressure checked every 3-5 years. If you are 23 years of age or older, have your blood pressure checked every year. You should have your blood pressure measured twice-once when you are at a hospital or clinic, and once when you are not at a hospital or clinic. Record the average of the two measurements. To check your blood pressure when you are not at a hospital or clinic, you can use:  An automated blood pressure machine at a pharmacy.  A home blood pressure monitor.  If you are between 27 years and 78 years old, ask your health care provider if you should take aspirin  to prevent strokes.  Have regular diabetes screenings. This involves taking a blood sample to check your fasting blood sugar level.  If you are at a normal weight and have a low risk for diabetes, have this test once every three years after 39 years of age.  If you are overweight and have a high risk for diabetes, consider being tested at a younger age or more often. Preventing infection Hepatitis B  If you have a higher risk for hepatitis B, you should be screened for this virus. You are considered at high risk for hepatitis B if:  You were born in a country where hepatitis B is common. Ask your health care provider which countries are considered high risk.  Your parents were born in a high-risk country, and you have not been immunized against hepatitis B (hepatitis B vaccine).  You have HIV or AIDS.  You use needles to inject street drugs.  You live with someone who has hepatitis B.  You have had sex with someone who has hepatitis B.  You get hemodialysis treatment.  You take certain medicines for conditions, including cancer, organ transplantation, and autoimmune conditions. Hepatitis C  Blood testing is recommended for:  Everyone born from [birth year] through 1963.  Anyone with known risk factors for hepatitis C. Sexually transmitted infections (STIs)  You should be screened for sexually transmitted infections (STIs) including gonorrhea and chlamydia if:  You are sexually active and are younger than 39 years of age.  You are older than 39 years of age and your health care provider tells you that you are at risk for this type of infection.  Your sexual activity has changed since you were last screened and you are at an increased risk for chlamydia or gonorrhea. Ask your health care provider if you are at risk.  If you do not have HIV, but are at risk, it may be recommended that you take a prescription medicine daily to prevent HIV infection. This is called pre-exposure  prophylaxis (PrEP). You are considered at risk if:  You are sexually active and do not regularly use condoms or know the HIV status of your partner(s).  You take drugs by injection.  You are sexually active with a partner who has HIV. Talk with your health care provider about whether you are at high risk of being infected with HIV. If you choose to begin PrEP, you should first be tested for HIV. You should then be tested every 3 months for as long  as you are taking PrEP. Pregnancy  If you are premenopausal and you may become pregnant, ask your health care provider about preconception counseling.  If you may become pregnant, take 400 to 800 micrograms (mcg) of folic acid every day.  If you want to prevent pregnancy, talk to your health care provider about birth control (contraception). Osteoporosis and menopause  Osteoporosis is a disease in which the bones lose minerals and strength with aging. This can result in serious bone fractures. Your risk for osteoporosis can be identified using a bone density scan.  If you are 23 years of age or older, or if you are at risk for osteoporosis and fractures, ask your health care provider if you should be screened.  Ask your health care provider whether you should take a calcium or vitamin D supplement to lower your risk for osteoporosis.  Menopause may have certain physical symptoms and risks.  Hormone replacement therapy may reduce some of these symptoms and risks. Talk to your health care provider about whether hormone replacement therapy is right for you. Follow these instructions at home:  Schedule regular health, dental, and eye exams.  Stay current with your immunizations.  Do not use any tobacco products including cigarettes, chewing tobacco, or electronic cigarettes.  If you are pregnant, do not drink alcohol.  If you are breastfeeding, limit how much and how often you drink alcohol.  Limit alcohol intake to no more than 1 drink  per day for nonpregnant women. One drink equals 12 ounces of beer, 5 ounces of wine, or 1 ounces of hard liquor.  Do not use street drugs.  Do not share needles.  Ask your health care provider for help if you need support or information about quitting drugs.  Tell your health care provider if you often feel depressed.  Tell your health care provider if you have ever been abused or do not feel safe at home. This information is not intended to replace advice given to you by your health care provider. Make sure you discuss any questions you have with your health care provider. Document Released: 10/29/2010 Document Revised: 09/21/2015 Document Reviewed: 01/17/2015  2017 [employer]

## 2016-05-29 LAB — LIPID PANEL
CHOL/HDL RATIO: 2.6 ratio (ref 0.0–4.4)
CHOLESTEROL TOTAL: 138 mg/dL (ref 100–199)
HDL: 53 mg/dL (ref >39)
LDL Calculated: 72 mg/dL (ref 0–99)
Triglycerides: 63 mg/dL (ref 0–149)
VLDL Cholesterol Cal: 13 mg/dL (ref 5–40)

## 2016-05-29 LAB — HEMOGLOBIN A1C
Est. average glucose Bld gHb Est-mCnc: 97 mg/dL
HEMOGLOBIN A1C: 5 % (ref 4.8–5.6)

## 2016-05-29 LAB — GLUCOSE, RANDOM: GLUCOSE: 96 mg/dL (ref 65–99)

## 2016-05-29 LAB — TSH: TSH: 1.67 u[IU]/mL (ref 0.450–4.500)

## 2016-05-29 LAB — VITAMIN D 25 HYDROXY (VIT D DEFICIENCY, FRACTURES): Vit D, 25-Hydroxy: 27.6 ng/mL — ABNORMAL LOW (ref 30.0–100.0)

## 2016-06-01 LAB — PAP IG AND HPV HIGH-RISK
HPV, high-risk: NEGATIVE
PAP Smear Comment: 0

## 2017-05-12 ENCOUNTER — Encounter: Payer: Self-pay | Admitting: Internal Medicine

## 2017-05-12 ENCOUNTER — Ambulatory Visit: Payer: 59 | Admitting: Internal Medicine

## 2017-05-12 VITALS — BP 126/68 | HR 77 | Temp 98.1°F | Wt 156.5 lb

## 2017-05-12 DIAGNOSIS — R1011 Right upper quadrant pain: Secondary | ICD-10-CM

## 2017-05-12 LAB — COMPREHENSIVE METABOLIC PANEL
ALT: 12 U/L (ref 0–35)
AST: 15 U/L (ref 0–37)
Albumin: 4.2 g/dL (ref 3.5–5.2)
Alkaline Phosphatase: 37 U/L — ABNORMAL LOW (ref 39–117)
BUN: 13 mg/dL (ref 6–23)
CHLORIDE: 104 meq/L (ref 96–112)
CO2: 27 meq/L (ref 19–32)
Calcium: 9 mg/dL (ref 8.4–10.5)
Creatinine, Ser: 0.74 mg/dL (ref 0.40–1.20)
GFR: 92.79 mL/min (ref >60.00)
GLUCOSE: 102 mg/dL — AB (ref 70–99)
POTASSIUM: 3.8 meq/L (ref 3.5–5.1)
SODIUM: 138 meq/L (ref 135–145)
TOTAL PROTEIN: 6.9 g/dL (ref 6.0–8.3)
Total Bilirubin: 0.5 mg/dL (ref 0.2–1.2)

## 2017-05-12 LAB — AMYLASE: Amylase: 53 U/L (ref 27–131)

## 2017-05-12 LAB — LIPASE: Lipase: 17 U/L (ref 11.0–59.0)

## 2017-05-12 NOTE — Progress Notes (Signed)
Subjective:    Patient ID: Caitlin Castillo, female    DOB: Oct 23, 1975, 42 y.o.   MRN: [id number]  HPI  Pt presents to the clinic today with c/o abdominal pain. She reports this started 2-3 weeks ago. The pain is located in the RUQ. She describes the pain as sharp and stabbing. The pain radiates into her back. She denies nausea, vomiting, constipation or diarrhea. She denies urinary symptoms or vaginal complaints. Eating dose not trigger it. She denies any injury to the area. She has tried some Tylenol # 3 and Ibuprofen with minimal relief. She denies recent changes in diet or medications. She does have a history of GERD, but she reports this is different. She also have a history of endometriosis, but reports this also feels very different.    Review of Systems      Past Medical History:  Diagnosis Date  . Allergy   . Anxiety    mild  . Chronic constipation   . Endometriosis   . Ovarian cyst   . Pelvic pain in female   . Right lower quadrant pain     No current outpatient medications on file.   No current facility-administered medications for this visit.     Allergies  Allergen Reactions  . Citrus   . Shellfish Allergy   . Tape   . Metrogel [Metronidazole] Rash    ??     Family History  Problem Relation Age of Onset  . Prostate cancer Maternal Grandfather   . Brain cancer Maternal Grandfather   . Stroke Maternal Grandfather   . Endometriosis Mother   . Drug abuse Maternal Aunt   . Drug abuse Maternal Uncle   . Breast cancer Maternal Uncle   . Colon cancer Neg Hx   . Ovarian cancer Neg Hx   . Cancer Neg Hx   . Diabetes Neg Hx   . Heart disease Neg Hx     Social History   Socioeconomic History  . Marital status: Married    Spouse name: Not on file  . Number of children: Not on file  . Years of education: Not on file  . Highest education level: Not on file  Social Needs  . Financial resource strain: Not on file  . Food insecurity - worry: Not on file  .  Food insecurity - inability: Not on file  . Transportation needs - medical: Not on file  . Transportation needs - non-medical: Not on file  Occupational History  . Not on file  Tobacco Use  . Smoking status: Never Smoker  . Smokeless tobacco: Never Used  Substance and Sexual Activity  . Alcohol use: Yes    Alcohol/week: 0.0 oz    Comment: occasional  . Drug use: No  . Sexual activity: Yes    Birth control/protection: Surgical    Comment: vacetomy  Other Topics Concern  . Not on file  Social History Narrative   ** Merged History Encounter **         Constitutional: Denies fever, malaise, fatigue, headache or abrupt weight changes.  Respiratory: Denies difficulty breathing, shortness of breath, cough or sputum production.   Cardiovascular: Denies chest pain, chest tightness, palpitations or swelling in the hands or feet.  Gastrointestinal: Pt reports abdominal pain. Denies bloating, constipation, diarrhea or blood in the stool.  GU: Denies urgency, frequency, pain with urination, burning sensation, blood in urine, odor or discharge.   No other specific complaints in a complete review of systems (except  as listed in HPI above).  Objective:   Physical Exam   BP 126/68   Pulse 77   Temp 98.1 F (36.7 C) (Oral)   Wt 156 lb 8 oz (71 kg)   LMP 05/09/2017   SpO2 99%   BMI 23.80 kg/m  Wt Readings from Last 3 Encounters:  05/12/17 156 lb 8 oz (71 kg)  05/28/16 178 lb 11.2 oz (81.1 kg)  09/09/15 165 lb (74.8 kg)    General: Appears her stated age, well developed, well nourished in NAD. Abdomen: Soft and nontender. Normal bowel sounds. No distention or masses noted. No CVA tenderness noted.  BMET    Component Value Date/Time   GLUCOSE 96 05/28/2016 1026    Lipid Panel     Component Value Date/Time   CHOL 138 05/28/2016 1026   TRIG 63 05/28/2016 1026   HDL 53 05/28/2016 1026   CHOLHDL 2.6 05/28/2016 1026   LDLCALC 72 05/28/2016 1026    CBC    Component Value  Date/Time   WBC 11.9 (H) 02/14/2012 0359   RBC 4.29 02/14/2012 0359   HGB 11.7 (L) 02/15/2012 0604   HCT 35.1 02/14/2012 0359   PLT 190 02/14/2012 0359   MCV 82 02/14/2012 0359   MCH 27.2 02/14/2012 0359   MCHC 33.2 02/14/2012 0359   RDW 13.9 02/14/2012 0359   LYMPHSABS 2.3 02/14/2012 0359   MONOABS 0.6 02/14/2012 0359   EOSABS 0.0 02/14/2012 0359   BASOSABS 0.0 02/14/2012 0359    Hgb A1C Lab Results  Component Value Date   HGBA1C 5.0 05/28/2016           Assessment & Plan:   RUQ Abdominal Pain:  Will check CMET, amylase and lipase May need RUQ ultrasound  Will follow up after labs, return precautions discussed Webb Silversmith, NP

## 2017-05-12 NOTE — Patient Instructions (Signed)

## 2017-05-14 ENCOUNTER — Ambulatory Visit: Payer: Self-pay | Admitting: Internal Medicine

## 2017-05-21 ENCOUNTER — Ambulatory Visit: Payer: Self-pay

## 2017-05-21 ENCOUNTER — Telehealth: Payer: Self-pay | Admitting: Internal Medicine

## 2017-05-21 DIAGNOSIS — R1011 Right upper quadrant pain: Secondary | ICD-10-CM

## 2017-05-21 NOTE — Telephone Encounter (Signed)
Copied from [place]. Topic: Quick Communication - See Telephone Encounter >> May 21, 2017  1:31 PM Caitlin Castillo wrote: CRM for notification. See Telephone encounter for:   05/21/17. Pt is still having abdominal pain, she previously declined to have ultra sound, but since it is not getting any better, she would like to have ultrasound  Cb# [id number]

## 2017-05-21 NOTE — Telephone Encounter (Signed)
Patient called in with c/o "abdominal pain." She says "the pain is not any better than it was when I was seen last week. I want to go ahead and have the ultrasound so I can see what is going on. The pain is in my upper right quadrant under my rib cage, mild pain, but it wakes me up from my sleep. I've had some nausea, but can't say if it's related to the abdominal pain or not. This pain has been going on for several weeks." According to protocol, see PCP within 2 weeks, appointment made with Webb Silversmith, [place] on Tuesday, 05/27/17 at 0800, care advice given, patient verbalized understanding, I advised I would let Rollene Fare know you would like the ultrasound preferably before the scheduled visit.   Reason for Disposition . Abdominal pain is a chronic symptom (recurrent or ongoing AND present > 4 weeks)  Answer Assessment - Initial Assessment Questions 1. LOCATION: "Where does it hurt?"      Upper right side under rib cage 2. RADIATION: "Does the pain shoot anywhere else?" (e.g., chest, back)     Around to right flank 3. ONSET: "When did the pain begin?" (e.g., minutes, hours or days ago)      Weeks 4. SUDDEN: "Gradual or sudden onset?"     Constant 5. PATTERN "Does the pain come and go, or is it constant?"    - If constant: "Is it getting better, staying the same, or worsening?"      (Note: Constant means the pain never goes away completely; most serious pain is constant and it progresses)     - If intermittent: "How long does it last?" "Do you have pain now?"     (Note: Intermittent means the pain goes away completely between bouts)     Constant-wake up from sleep 6. SEVERITY: "How bad is the pain?"  (e.g., Scale 1-10; mild, moderate, or severe)    - MILD (1-3): doesn't interfere with normal activities, abdomen soft and not tender to touch     - MODERATE (4-7): interferes with normal activities or awakens from sleep, tender to touch     - SEVERE (8-10): excruciating pain, doubled over, unable to  do any normal activities       Mild 7. RECURRENT SYMPTOM: "Have you ever had this type of abdominal pain before?" If so, ask: "When was the last time?" and "What happened that time?"      No-this has been going on for a few weeks 8. AGGRAVATING FACTORS: "Does anything seem to cause this pain?" (e.g., foods, stress, alcohol)     Nothing 9. CARDIAC SYMPTOMS: "Do you have any of the following symptoms: chest pain, difficulty breathing, sweating, nausea?"     Mild nausea sometimes 10. OTHER SYMPTOMS: "Do you have any other symptoms?" (e.g., fever, vomiting, diarrhea)       Denies 11. PREGNANCY: "Is there any chance you are pregnant?" "When was your last menstrual period?"       No  Protocols used: ABDOMINAL PAIN - UPPER-A-AH

## 2017-05-21 NOTE — Telephone Encounter (Signed)
Ultrasound ordered

## 2017-05-21 NOTE — Addendum Note (Signed)
Addended by: Jearld Fenton on: 05/21/2017 02:38 PM   Modules accepted: Orders

## 2017-05-21 NOTE — Telephone Encounter (Signed)
Patient called and said she would like to have ultrasound. She was triaged and an appointment was made for 05/27/17. She said she would like to have the ultrasound prior to that date, so the results can be discussed at her OV.

## 2017-05-23 ENCOUNTER — Ambulatory Visit
Admission: RE | Admit: 2017-05-23 | Discharge: 2017-05-23 | Disposition: A | Discharge status: Home / Self Care | Payer: 59 | Source: Ambulatory Visit | Attending: Internal Medicine | Admitting: Internal Medicine

## 2017-05-23 DIAGNOSIS — R1011 Right upper quadrant pain: Secondary | ICD-10-CM | POA: Insufficient documentation

## 2017-05-27 ENCOUNTER — Ambulatory Visit: Payer: Self-pay | Admitting: Internal Medicine

## 2017-05-29 ENCOUNTER — Encounter: Payer: 59 | Admitting: Obstetrics and Gynecology

## 2017-06-13 NOTE — Progress Notes (Deleted)
Patient ID: Caitlin Castillo, female   DOB: 07-31-1975, 42 y.o.   MRN: [id number] ANNUAL PREVENTATIVE CARE GYN  ENCOUNTER NOTE  Subjective:       Caitlin Castillo is a 42 y.o. [id number] female here for a routine annual gynecologic exam.  Current complaints: 1.   Bowel and bladder function is normal. Patient is to get screening labs today.   Gynecologic History lmp  Contraception: vasectomy Last Pap: 05/28/2016 neg/neg. Results were: normal Last mammogram: n/a   Obstetric History OB History  Gravida Para Term Preterm AB Living  6 4 4  0 0 5  SAB TAB Ectopic Multiple Live Births  0 0 0   5    # Outcome Date GA Lbr Len/2nd Weight Sex Delivery Anes PTL Lv  6 Gravida           5 Term 2013     Vag-Spont   LIV  4 Term 2009     Vag-Spont   LIV  3 Term 2005     VBAC   LIV  2 Term 2003     Vag-Spont   LIV  1 Gravida 1999     Vag-Spont   LIV      Past Medical History:  Diagnosis Date  . Allergy   . Anxiety    mild  . Chronic constipation   . Endometriosis   . Ovarian cyst   . Pelvic pain in female   . Right lower quadrant pain     Past Surgical History:  Procedure Laterality Date  . TONSILLECTOMY AND ADENOIDECTOMY      No current outpatient medications on file prior to visit.   No current facility-administered medications on file prior to visit.     Allergies  Allergen Reactions  . Citrus   . Shellfish Allergy   . Tape   . Metrogel [Metronidazole] Rash    ??     Social History   Socioeconomic History  . Marital status: Married    Spouse name: Not on file  . Number of children: Not on file  . Years of education: Not on file  . Highest education level: Not on file  Social Needs  . Financial resource strain: Not on file  . Food insecurity - worry: Not on file  . Food insecurity - inability: Not on file  . Transportation needs - medical: Not on file  . Transportation needs - non-medical: Not on file  Occupational History  . Not on file  Tobacco Use  .  Smoking status: Never Smoker  . Smokeless tobacco: Never Used  Substance and Sexual Activity  . Alcohol use: Yes    Alcohol/week: 0.0 oz    Comment: occasional  . Drug use: No  . Sexual activity: Yes    Birth control/protection: Surgical    Comment: vacetomy  Other Topics Concern  . Not on file  Social History Narrative   ** Merged History Encounter **        Family History  Problem Relation Age of Onset  . Prostate cancer Maternal Grandfather   . Brain cancer Maternal Grandfather   . Stroke Maternal Grandfather   . Endometriosis Mother   . Drug abuse Maternal Aunt   . Drug abuse Maternal Uncle   . Breast cancer Maternal Uncle   . Colon cancer Neg Hx   . Ovarian cancer Neg Hx   . Cancer Neg Hx   . Diabetes Neg Hx   . Heart disease Neg Hx  The following portions of the patient's history were reviewed and updated as appropriate: allergies, current medications, past family history, past medical history, past social history, past surgical history and problem list.  Review of Systems ROS   Objective:    There were no vitals taken for this visit. CONSTITUTIONAL: Well-developed, well-nourished female in no acute distress.  PSYCHIATRIC: Normal mood and affect. Normal behavior. Normal judgment and thought content. [place]: Alert and oriented to person, place, and time. Normal muscle tone coordination. No cranial nerve deficit noted. HENT:  Normocephalic, atraumatic, External right and left ear normal. Oropharynx is clear and moist EYES: Conjunctivae and EOM are normal. Pupils are equal, round, and reactive to light. No scleral icterus.  NECK: Normal range of motion, supple, no masses.  Normal thyroid.  SKIN: Skin is warm and dry. No rash noted. Not diaphoretic. No erythema. No pallor. CARDIOVASCULAR: Normal heart rate noted, regular rhythm, no murmur. RESPIRATORY: Clear to auscultation bilaterally. Effort and breath sounds normal, no problems with respiration  noted. BREASTS: Symmetric in size. No masses, skin changes, nipple drainage, or lymphadenopathy. ABDOMEN: Soft, normal bowel sounds, no distention noted.  No tenderness, rebound or guarding.  BLADDER: Normal PELVIC:  External Genitalia: Normal  BUS: Normal  Vagina: Normal   Cervix: Normal; No lesions; parous  Uterus: Normal; midplane, normal size and shape, mobile, nontender 6  Adnexa: Normal  RV: External Exam NormaI  MUSCULOSKELETAL: Normal range of motion. No tenderness.  No cyanosis, clubbing, or edema.  2+ distal pulses. LYMPHATIC: No Axillary, Supraclavicular, or Inguinal Adenopathy.    Assessment:   Annual gynecologic examination 42 y.o. Contraception: vasectomy bmi and Normal BMI- 27 Problem List Items Addressed This Visit    None    Visit Diagnoses    Well woman exam with routine gynecological exam    -  Primary   Increased BMI          Plan:  Pap:Due 2021 Mammogram: Not Indicated Stool Guaiac Testing:  Not Indicated Labs: tsh vit d fbs a1c lipid Routine preventative health maintenance measures emphasized: Exercise/Diet/Weight control, Tobacco Warnings and Alcohol/Substance use risks Return to [place], [place]    Note: This dictation was prepared with Dragon dictation along with smaller phrase technology. Any transcriptional errors that result from this process are unintentional. '

## 2017-06-18 ENCOUNTER — Encounter: Payer: 59 | Admitting: Obstetrics and Gynecology

## 2017-07-07 ENCOUNTER — Ambulatory Visit (INDEPENDENT_AMBULATORY_CARE_PROVIDER_SITE_OTHER): Payer: 59 | Admitting: Obstetrics and Gynecology

## 2017-07-07 ENCOUNTER — Encounter: Payer: Self-pay | Admitting: Obstetrics and Gynecology

## 2017-07-07 VITALS — BP 124/79 | HR 74 | Ht 68.0 in | Wt 156.7 lb

## 2017-07-07 DIAGNOSIS — N809 Endometriosis, unspecified: Secondary | ICD-10-CM

## 2017-07-07 DIAGNOSIS — I471 Supraventricular tachycardia: Secondary | ICD-10-CM | POA: Diagnosis not present

## 2017-07-07 DIAGNOSIS — R634 Abnormal weight loss: Secondary | ICD-10-CM

## 2017-07-07 DIAGNOSIS — Z01419 Encounter for gynecological examination (general) (routine) without abnormal findings: Secondary | ICD-10-CM | POA: Diagnosis not present

## 2017-07-07 NOTE — Patient Instructions (Signed)
1.  No Pap smear needed.  Next Pap smear due in 2021 2.  Self breast awareness encouraged 3.  Continue with healthy eating and exercise 4.  Screening labs are ordered 5.  Return in 1 year for annual exam 6.  Contraception-vasectomy  Health Maintenance, Female Adopting a healthy lifestyle and getting preventive care can go a long way to promote health and wellness. Talk with your health care provider about what schedule of regular examinations is right for you. This is a good chance for you to check in with your provider about disease prevention and staying healthy. In between checkups, there are plenty of things you can do on your own. Experts have done a lot of research about which lifestyle changes and preventive measures are most likely to keep you healthy. Ask your health care provider for more information. Weight and diet Eat a healthy diet  Be sure to include plenty of vegetables, fruits, low-fat dairy products, and lean protein.  Do not eat a lot of foods high in solid fats, added sugars, or salt.  Get regular exercise. This is one of the most important things you can do for your health. ? Most adults should exercise for at least 150 minutes each week. The exercise should increase your heart rate and make you sweat (moderate-intensity exercise). ? Most adults should also do strengthening exercises at least twice a week. This is in addition to the moderate-intensity exercise.  Maintain a healthy weight  Body mass index (BMI) is a measurement that can be used to identify possible weight problems. It estimates body fat based on height and weight. Your health care provider can help determine your BMI and help you achieve or maintain a healthy weight.  For females 55 years of age and older: ? A BMI below 18.5 is considered underweight. ? A BMI of 18.5 to 24.9 is normal. ? A BMI of 25 to 29.9 is considered overweight. ? A BMI of 30 and above is considered obese.  Watch levels of  cholesterol and blood lipids  You should start having your blood tested for lipids and cholesterol at 40 years of age, then have this test every 5 years.  You may need to have your cholesterol levels checked more often if: ? Your lipid or cholesterol levels are high. ? You are older than 40 years of age. ? You are at high risk for heart disease.  Cancer screening Lung Cancer  Lung cancer screening is recommended for adults 87-61 years old who are at high risk for lung cancer because of a history of smoking.  A yearly low-dose CT scan of the lungs is recommended for people who: ? Currently smoke. ? Have quit within the past 15 years. ? Have at least a 30-pack-year history of smoking. A pack year is smoking an average of one pack of cigarettes a day for 1 year.  Yearly screening should continue until it has been 15 years since you quit.  Yearly screening should stop if you develop a health problem that would prevent you from having lung cancer treatment.  Breast Cancer  Practice breast self-awareness. This means understanding how your breasts normally appear and feel.  It also means doing regular breast self-exams. Let your health care provider know about any changes, no matter how small.  If you are in your 20s or 30s, you should have a clinical breast exam (CBE) by a health care provider every 1-3 years as part of a regular health exam.  If you are 40 or older, have a CBE every year. Also consider having a breast X-ray (mammogram) every year.  If you have a family history of breast cancer, talk to your health care provider about genetic screening.  If you are at high risk for breast cancer, talk to your health care provider about having an MRI and a mammogram every year.  Breast cancer gene (BRCA) assessment is recommended for women who have family members with BRCA-related cancers. BRCA-related cancers include: ? Breast. ? Ovarian. ? Tubal. ? Peritoneal cancers.  Results  of the assessment will determine the need for genetic counseling and BRCA1 and BRCA2 testing.  Cervical Cancer Your health care provider may recommend that you be screened regularly for cancer of the pelvic organs (ovaries, uterus, and vagina). This screening involves a pelvic examination, including checking for microscopic changes to the surface of your cervix (Pap test). You may be encouraged to have this screening done every 3 years, beginning at age 21.  For women ages 30-65, health care providers may recommend pelvic exams and Pap testing every 3 years, or they may recommend the Pap and pelvic exam, combined with testing for human papilloma virus (HPV), every 5 years. Some types of HPV increase your risk of cervical cancer. Testing for HPV may also be done on women of any age with unclear Pap test results.  Other health care providers may not recommend any screening for nonpregnant women who are considered low risk for pelvic cancer and who do not have symptoms. Ask your health care provider if a screening pelvic exam is right for you.  If you have had past treatment for cervical cancer or a condition that could lead to cancer, you need Pap tests and screening for cancer for at least 20 years after your treatment. If Pap tests have been discontinued, your risk factors (such as having a new sexual partner) need to be reassessed to determine if screening should resume. Some women have medical problems that increase the chance of getting cervical cancer. In these cases, your health care provider may recommend more frequent screening and Pap tests.  Colorectal Cancer  This type of cancer can be detected and often prevented.  Routine colorectal cancer screening usually begins at 40 years of age and continues through 40 years of age.  Your health care provider may recommend screening at an earlier age if you have risk factors for colon cancer.  Your health care provider may also recommend using  home test kits to check for hidden blood in the stool.  A small camera at the end of a tube can be used to examine your colon directly (sigmoidoscopy or colonoscopy). This is done to check for the earliest forms of colorectal cancer.  Routine screening usually begins at age 50.  Direct examination of the colon should be repeated every 5-10 years through 40 years of age. However, you may need to be screened more often if early forms of precancerous polyps or small growths are found.  Skin Cancer  Check your skin from head to toe regularly.  Tell your health care provider about any new moles or changes in moles, especially if there is a change in a mole's shape or color.  Also tell your health care provider if you have a mole that is larger than the size of a pencil eraser.  Always use sunscreen. Apply sunscreen liberally and repeatedly throughout the day.  Protect yourself by wearing long sleeves, pants, a wide-brimmed hat, and   sunglasses whenever you are outside.  Heart disease, diabetes, and high blood pressure  High blood pressure causes heart disease and increases the risk of stroke. High blood pressure is more likely to develop in: ? People who have blood pressure in the high end of the normal range (130-139/85-89 mm Hg). ? People who are overweight or obese. ? People who are African American.  If you are 44-32 years of age, have your blood pressure checked every 3-5 years. If you are 62 years of age or older, have your blood pressure checked every year. You should have your blood pressure measured twice-once when you are at a hospital or clinic, and once when you are not at a hospital or clinic. Record the average of the two measurements. To check your blood pressure when you are not at a hospital or clinic, you can use: ? An automated blood pressure machine at a pharmacy. ? A home blood pressure monitor.  If you are between 71 years and 6 years old, ask your health care provider  if you should take aspirin to prevent strokes.  Have regular diabetes screenings. This involves taking a blood sample to check your fasting blood sugar level. ? If you are at a normal weight and have a low risk for diabetes, have this test once every three years after 40 years of age. ? If you are overweight and have a high risk for diabetes, consider being tested at a younger age or more often. Preventing infection Hepatitis B  If you have a higher risk for hepatitis B, you should be screened for this virus. You are considered at high risk for hepatitis B if: ? You were born in a country where hepatitis B is common. Ask your health care provider which countries are considered high risk. ? Your parents were born in a high-risk country, and you have not been immunized against hepatitis B (hepatitis B vaccine). ? You have HIV or AIDS. ? You use needles to inject street drugs. ? You live with someone who has hepatitis B. ? You have had sex with someone who has hepatitis B. ? You get hemodialysis treatment. ? You take certain medicines for conditions, including cancer, organ transplantation, and autoimmune conditions.  Hepatitis C  Blood testing is recommended for: ? Everyone born from [birth year] through 1963. ? Anyone with known risk factors for hepatitis C.  Sexually transmitted infections (STIs)  You should be screened for sexually transmitted infections (STIs) including gonorrhea and chlamydia if: ? You are sexually active and are younger than 40 years of age. ? You are older than 40 years of age and your health care provider tells you that you are at risk for this type of infection. ? Your sexual activity has changed since you were last screened and you are at an increased risk for chlamydia or gonorrhea. Ask your health care provider if you are at risk.  If you do not have HIV, but are at risk, it may be recommended that you take a prescription medicine daily to prevent HIV infection. This  is called pre-exposure prophylaxis (PrEP). You are considered at risk if: ? You are sexually active and do not regularly use condoms or know the HIV status of your partner(s). ? You take drugs by injection. ? You are sexually active with a partner who has HIV.  Talk with your health care provider about whether you are at high risk of being infected with HIV. If you choose to begin PrEP, you  should first be tested for HIV. You should then be tested every 3 months for as long as you are taking PrEP. Pregnancy  If you are premenopausal and you may become pregnant, ask your health care provider about preconception counseling.  If you may become pregnant, take 400 to 800 micrograms (mcg) of folic acid every day.  If you want to prevent pregnancy, talk to your health care provider about birth control (contraception). Osteoporosis and menopause  Osteoporosis is a disease in which the bones lose minerals and strength with aging. This can result in serious bone fractures. Your risk for osteoporosis can be identified using a bone density scan.  If you are 73 years of age or older, or if you are at risk for osteoporosis and fractures, ask your health care provider if you should be screened.  Ask your health care provider whether you should take a calcium or vitamin D supplement to lower your risk for osteoporosis.  Menopause may have certain physical symptoms and risks.  Hormone replacement therapy may reduce some of these symptoms and risks. Talk to your health care provider about whether hormone replacement therapy is right for you. Follow these instructions at home:  Schedule regular health, dental, and eye exams.  Stay current with your immunizations.  Do not use any tobacco products including cigarettes, chewing tobacco, or electronic cigarettes.  If you are pregnant, do not drink alcohol.  If you are breastfeeding, limit how much and how often you drink alcohol.  Limit alcohol intake  to no more than 1 drink per day for nonpregnant women. One drink equals 12 ounces of beer, 5 ounces of wine, or 1 ounces of hard liquor.  Do not use street drugs.  Do not share needles.  Ask your health care provider for help if you need support or information about quitting drugs.  Tell your health care provider if you often feel depressed.  Tell your health care provider if you have ever been abused or do not feel safe at home. This information is not intended to replace advice given to you by your health care provider. Make sure you discuss any questions you have with your health care provider. Document Released: 10/29/2010 Document Revised: 09/21/2015 Document Reviewed: 01/17/2015 Elsevier Interactive Patient Education  [employer].

## 2017-07-07 NOTE — Progress Notes (Signed)
Patient ID: [facility] RUNKEL, female   DOB: Jun 23, 1975, 42 y.o.   MRN: [id number] ANNUAL PREVENTATIVE CARE GYN  ENCOUNTER NOTE  Subjective:       Caitlin Castillo is a 42 y.o. G21P4005 female here for a routine annual gynecologic exam.  Current complaints: 1. none  Bowel and bladder function is normal. Patient is to get screening labs today. Patient has lost 30 pounds over the past year using weight watchers. Menses are regular and initially heavy with moderate pain controlled with medication; history of endometriosis.    Gynecologic History lmp -06/30/2017 Contraception: vasectomy Last Pap: 05/28/2016 neg/neg. Results were: normal Last mammogram: n/a   Obstetric History OB History  Gravida Para Term Preterm AB Living  6 4 4  0 0 5  SAB TAB Ectopic Multiple Live Births  0 0 0   5    # Outcome Date GA Lbr Len/2nd Weight Sex Delivery Anes PTL Lv  6 Gravida           5 Term 2013     Vag-Spont   LIV  4 Term 2009     Vag-Spont   LIV  3 Term 2005     VBAC   LIV  2 Term 2003     Vag-Spont   LIV  1 Gravida 1999     Vag-Spont   LIV      Past Medical History:  Diagnosis Date  . Allergy   . Anxiety    mild  . Chronic constipation   . Endometriosis   . Ovarian cyst   . Pelvic pain in female   . Right lower quadrant pain     Past Surgical History:  Procedure Laterality Date  . TONSILLECTOMY AND ADENOIDECTOMY      No current outpatient medications on file prior to visit.   No current facility-administered medications on file prior to visit.     Allergies  Allergen Reactions  . Citrus   . Shellfish Allergy   . Tape   . Metrogel [Metronidazole] Rash    ??     Social History   Socioeconomic History  . Marital status: Married    Spouse name: Not on file  . Number of children: Not on file  . Years of education: Not on file  . Highest education level: Not on file  Social Needs  . Financial resource strain: Not on file  . Food insecurity - worry: Not on file  . Food  insecurity - inability: Not on file  . Transportation needs - medical: Not on file  . Transportation needs - non-medical: Not on file  Occupational History  . Not on file  Tobacco Use  . Smoking status: Never Smoker  . Smokeless tobacco: Never Used  Substance and Sexual Activity  . Alcohol use: Yes    Alcohol/week: 0.0 oz    Comment: occasional  . Drug use: No  . Sexual activity: Yes    Birth control/protection: Surgical    Comment: vacetomy  Other Topics Concern  . Not on file  Social History Narrative   ** Merged History Encounter **        Family History  Problem Relation Age of Onset  . Prostate cancer Maternal Grandfather   . Brain cancer Maternal Grandfather   . Stroke Maternal Grandfather   . Endometriosis Mother   . Drug abuse Maternal Aunt   . Drug abuse Maternal Uncle   . Breast cancer Maternal Uncle   . Colon cancer Neg Hx   .  Ovarian cancer Neg Hx   . Cancer Neg Hx   . Diabetes Neg Hx   . Heart disease Neg Hx     The following portions of the patient's history were reviewed and updated as appropriate: allergies, current medications, past family history, past medical history, past social history, past surgical history and problem list.  Review of Systems Review of Systems  Constitutional: Negative.   Eyes: Negative.   Respiratory: Negative.   Cardiovascular: Negative.   Gastrointestinal: Negative.   Genitourinary: Negative.        Mild to moderate dysmenorrhea  Musculoskeletal: Negative.   Skin: Negative.   Neurological: Negative.   Endo/Heme/Allergies: Negative.   Psychiatric/Behavioral: Negative.      Objective:    BP 124/79   Pulse 74   Ht 5\' 8"  (1.727 m)   Wt 156 lb 11.2 oz (71.1 kg)   LMP 06/30/2017   BMI 23.83 kg/m  CONSTITUTIONAL: Well-developed, well-nourished female in no acute distress.  PSYCHIATRIC: Normal mood and affect. Normal behavior. Normal judgment and thought content. [place]: Alert and oriented to person, place, and  time. Normal muscle tone coordination. No cranial nerve deficit noted. HENT:  Normocephalic, atraumatic, External right and left ear normal. Oropharynx is clear and moist EYES: Conjunctivae and EOM are normal.  No scleral icterus.  NECK: Normal range of motion, supple, no masses.  Normal thyroid.  SKIN: Skin is warm and dry. No rash noted. Not diaphoretic. No erythema. No pallor. CARDIOVASCULAR: Normal heart rate noted, regular rhythm, no murmur. RESPIRATORY: Clear to auscultation bilaterally. Effort and breath sounds normal, no problems with respiration noted. BREASTS: Symmetric in size. No masses, skin changes, nipple drainage, or lymphadenopathy. ABDOMEN: Soft, normal bowel sounds, no distention noted.  No tenderness, rebound or guarding.  BLADDER: Normal PELVIC:  External Genitalia: Normal  BUS: Normal  Vagina: Normal   Cervix: Normal; No lesions; parous; large eversion  Uterus: Normal; midplane, normal size and shape, mobile, nontender   Adnexa: Normal; nonpalpable nontender  RV: External Exam NormaI  MUSCULOSKELETAL: Normal range of motion. No tenderness.  No cyanosis, clubbing, or edema.  2+ distal pulses. LYMPHATIC: No Axillary, Supraclavicular, or Inguinal Adenopathy.    Assessment:   Annual gynecologic examination 42 y.o. Contraception: vasectomy bmi and Normal BMI-23 Problem List Items Addressed This Visit    None    Visit Diagnoses    Well woman exam with routine gynecological exam    -  Primary    weight loss-now with normal BMI      Plan:  Pap:Due 2021 Mammogram: Not Indicated Stool Guaiac Testing:  Not Indicated Labs: tsh lipid a1c fbs Routine preventative health maintenance measures emphasized: Exercise/Diet/Weight control, Tobacco Warnings and Alcohol/Substance use risks Return to [place], CMA  Brayton Mars, MD  Note: This dictation was prepared with Dragon dictation along with smaller phrase technology. Any  transcriptional errors that result from this process are unintentional.

## 2018-01-01 ENCOUNTER — Ambulatory Visit: Payer: 59 | Admitting: Internal Medicine

## 2018-01-01 ENCOUNTER — Encounter: Payer: Self-pay | Admitting: Internal Medicine

## 2018-01-01 VITALS — BP 118/76 | HR 95 | Temp 98.3°F | Wt 158.0 lb

## 2018-01-01 DIAGNOSIS — R109 Unspecified abdominal pain: Secondary | ICD-10-CM

## 2018-01-01 DIAGNOSIS — R350 Frequency of micturition: Secondary | ICD-10-CM

## 2018-01-01 DIAGNOSIS — R110 Nausea: Secondary | ICD-10-CM | POA: Diagnosis not present

## 2018-01-01 DIAGNOSIS — R3915 Urgency of urination: Secondary | ICD-10-CM

## 2018-01-01 DIAGNOSIS — R1011 Right upper quadrant pain: Secondary | ICD-10-CM

## 2018-01-01 LAB — COMPREHENSIVE METABOLIC PANEL
ALBUMIN: 4.3 g/dL (ref 3.5–5.2)
ALK PHOS: 39 U/L (ref 39–117)
ALT: 14 U/L (ref 0–35)
AST: 15 U/L (ref 0–37)
BILIRUBIN TOTAL: 0.6 mg/dL (ref 0.2–1.2)
BUN: 14 mg/dL (ref 6–23)
CALCIUM: 9.3 mg/dL (ref 8.4–10.5)
CO2: 30 meq/L (ref 19–32)
Chloride: 103 mEq/L (ref 96–112)
Creatinine, Ser: 0.84 mg/dL (ref 0.40–1.20)
GFR: 79.9 mL/min (ref >60.00)
Glucose, Bld: 97 mg/dL (ref 70–99)
Potassium: 4.1 mEq/L (ref 3.5–5.1)
Sodium: 138 mEq/L (ref 135–145)
Total Protein: 7.1 g/dL (ref 6.0–8.3)

## 2018-01-01 LAB — POC URINALSYSI DIPSTICK (AUTOMATED)
Bilirubin, UA: NEGATIVE
Glucose, UA: NEGATIVE
Ketones, UA: NEGATIVE
Leukocytes, UA: NEGATIVE
NITRITE UA: NEGATIVE
PH UA: 7.5 (ref 5.0–8.0)
PROTEIN UA: NEGATIVE
RBC UA: NEGATIVE
SPEC GRAV UA: 1.015 (ref 1.010–1.025)
Urobilinogen, UA: 0.2 E.U./dL

## 2018-01-01 NOTE — Patient Instructions (Signed)
Flank Pain Flank pain is pain in your side. The flank is the area of your side between your upper belly (abdomen) and your back. The pain may occur over a short period of time (acute) or may be long-term or come back often (chronic). It may be mild or very bad. Pain in this area can be caused by many different things. Follow these instructions at home:  Rest as told by your doctor.  Drink enough fluid to keep your pee (urine) clear or pale yellow.  Take over-the-counter and prescription medicines only as told by your doctor.  Keep all follow-up visits as told by your doctor. This is important. Contact a doctor if:  Medicine does not help your pain.  You have new symptoms.  Your pain gets worse.  You have a fever.  Your symptoms last longer than 2-3 days. Get help right away if:  Your tummy hurts or is swollen.  You are short of breath.  You feel sick to your stomach (nauseous) and it does not go away.  You cannot stop throwing up (vomiting).  You feel like you will pass out or you do pass out (faint).  You have blood in your pee.  You have a fever and your symptoms suddenly get worse. This information is not intended to replace advice given to you by your health care provider. Make sure you discuss any questions you have with your health care provider. Document Released: 01/23/2008 Document Revised: 01/05/2016 Document Reviewed: 01/17/2015 Elsevier Interactive Patient Education  2018 [employer]  

## 2018-01-01 NOTE — Progress Notes (Signed)
Subjective:    Patient ID: Caitlin Castillo, female    DOB: [birth year], 42 y.o.   MRN: [id number]  HPI  Pt presents to the clinic today with c/o right flank pain. She reports this started 2-3 weeks ago. She describes the pain as achy. The pain radiates to her right upper quadrant. She describes the abdominal pain as pinching and sharp. The pain does not occur in relation to eating. She reports associated urinary frequency and urgency but denies blood in her urine. She reports nausea but no vomiting. Her bowels are moving normally. She has not taken anything OTC for this. She has a history of GERD but does not take anything OTC for this. She has had similar issues in the past. She had a RUQ ultrasound 04/2017 which was normal.   Review of Systems      Past Medical History:  Diagnosis Date  . Allergy   . Anxiety    mild  . Chronic constipation   . Endometriosis   . Ovarian cyst   . Pelvic pain in female   . Right lower quadrant pain     No current outpatient medications on file.   No current facility-administered medications for this visit.     Allergies  Allergen Reactions  . Citrus   . Shellfish Allergy   . Tape   . Metrogel [Metronidazole] Rash    ??   . Tylenol With Codeine #3 [Acetaminophen-Codeine] Rash    Family History  Problem Relation Age of Onset  . Prostate cancer Maternal Grandfather   . Brain cancer Maternal Grandfather   . Stroke Maternal Grandfather   . Endometriosis Mother   . Drug abuse Maternal Aunt   . Drug abuse Maternal Uncle   . Breast cancer Maternal Uncle   . Colon cancer Neg Hx   . Ovarian cancer Neg Hx   . Cancer Neg Hx   . Diabetes Neg Hx   . Heart disease Neg Hx     Social History   Socioeconomic History  . Marital status: Married    Spouse name: Not on file  . Number of children: Not on file  . Years of education: Not on file  . Highest education level: Not on file  Occupational History  . Not on file  Social Needs  .  Financial resource strain: Not on file  . Food insecurity:    Worry: Not on file    Inability: Not on file  . Transportation needs:    Medical: Not on file    Non-medical: Not on file  Tobacco Use  . Smoking status: Never Smoker  . Smokeless tobacco: Never Used  Substance and Sexual Activity  . Alcohol use: Yes    Alcohol/week: 0.0 standard drinks    Comment: occasional  . Drug use: No  . Sexual activity: Yes    Birth control/protection: Surgical    Comment: vasctomy  Lifestyle  . Physical activity:    Days per week: 2 days    Minutes per session: 30 min  . Stress: Not on file  Relationships  . Social connections:    Talks on phone: Not on file    Gets together: Not on file    Attends religious service: Not on file    Active member of club or organization: Not on file    Attends meetings of clubs or organizations: Not on file    Relationship status: Not on file  . Intimate partner violence:  Fear of current or ex partner: Not on file    Emotionally abused: Not on file    Physically abused: Not on file    Forced sexual activity: Not on file  Other Topics Concern  . Not on file  Social History Narrative   ** Merged History Encounter **         Constitutional: Denies fever, malaise, fatigue, headache or abrupt weight changes.  Respiratory: Denies difficulty breathing, shortness of breath, cough or sputum production.   Cardiovascular: Denies chest pain, chest tightness, palpitations or swelling in the hands or feet.  Gastrointestinal: Pt report RUQ abdominal pain. Denies bloating, constipation, diarrhea or blood in the stool.  GU: Pt reports urinary urgency, frequency and right side flank pain. Denies pain with urination, burning sensation, blood in urine, odor or discharge. Musculoskeletal: Denies decrease in range of motion, difficulty with gait, muscle pain or joint pain and swelling.  Skin: Denies redness, rashes, lesions or ulcercations.   No other specific  complaints in a complete review of systems (except as listed in HPI above).  Objective:   Physical Exam   BP 118/76   Pulse 95   Temp 98.3 F (36.8 C) (Oral)   Wt 158 lb (71.7 kg)   LMP 12/20/2017   SpO2 98%   BMI 24.02 kg/m  Wt Readings from Last 3 Encounters:  01/01/18 158 lb (71.7 kg)  07/07/17 156 lb 11.2 oz (71.1 kg)  05/12/17 156 lb 8 oz (71 kg)    General: Appears her stated age, well developed, well nourished in NAD. Skin: Warm, dry and intact. No rashes noted. Cardiovascular: Normal rate and rhythm. S1,S2 noted.  No murmur, rubs or gallops noted.  Pulmonary/Chest: Normal effort and positive vesicular breath sounds. No respiratory distress. No wheezes, rales or ronchi noted.  Abdomen: Soft and nontender. Normal bowel sounds. No distention or masses noted. No CVA tenderness noted.  Musculoskeletal: No pain with palpation of rigs, spine, paraspinal muscles. Neurological: Alert and oriented.   BMET    Component Value Date/Time   NA 138 05/12/2017 1242   K 3.8 05/12/2017 1242   CL 104 05/12/2017 1242   CO2 27 05/12/2017 1242   GLUCOSE 102 (H) 05/12/2017 1242   BUN 13 05/12/2017 1242   CREATININE 0.74 05/12/2017 1242   CALCIUM 9.0 05/12/2017 1242    Lipid Panel     Component Value Date/Time   CHOL 138 05/28/2016 1026   TRIG 63 05/28/2016 1026   HDL 53 05/28/2016 1026   CHOLHDL 2.6 05/28/2016 1026   LDLCALC 72 05/28/2016 1026    CBC    Component Value Date/Time   WBC 11.9 (H) 02/14/2012 0359   RBC 4.29 02/14/2012 0359   HGB 11.7 (L) 02/15/2012 0604   HCT 35.1 02/14/2012 0359   PLT 190 02/14/2012 0359   MCV 82 02/14/2012 0359   MCH 27.2 02/14/2012 0359   MCHC 33.2 02/14/2012 0359   RDW 13.9 02/14/2012 0359   LYMPHSABS 2.3 02/14/2012 0359   MONOABS 0.6 02/14/2012 0359   EOSABS 0.0 02/14/2012 0359   BASOSABS 0.0 02/14/2012 0359    Hgb A1C Lab Results  Component Value Date   HGBA1C 5.0 05/28/2016           Assessment & Plan:    Persistent RUQ Pain, Right Flank Pain, Nausea, Urinary Frequency, Urinary Urgency:  Urinalysis: normal Low suspicion for kidney stone No indication to repeat RUQ ultrasound Discussed HIDA scan but I think we would get more benefit from  CT Abdomen- ordered CMET today  Will follow up after CT scan, return precautions discussed Webb Silversmith, NP

## 2018-01-01 NOTE — Addendum Note (Signed)
Addended by: Lurlean Nanny on: 01/01/2018 11:01 AM   Modules accepted: Orders

## 2018-01-05 ENCOUNTER — Ambulatory Visit: Payer: Self-pay | Admitting: Internal Medicine

## 2018-01-05 NOTE — Telephone Encounter (Addendum)
I spoke with pt and she is working nights so either needs to come in very early or after 3 PM. Pt scheduled 30' appt 01/06/18 at 4PM due to foul smelling urine(unusual smell) and fullness feeling after urinates. No fever.pt also wants to discuss about possibly adding pelvis to abd CT that has been ordered. If pt condition worsens prior to appt pt will cb to [facility] or at night go to ED. FYI to Avie Echevaria NP.

## 2018-01-05 NOTE — Telephone Encounter (Signed)
Pt called with C/O foul smelling urine and fullness after urination.  She was seen in the office on 9/5 19 for symptoms of UTI and flank pain Pt denies fever or  presence of  Blood in urine. Urine diptick was negative on 01/01/18 and patient has now had a worsening of symptoms.  Pt is requesting to drop off a urine sample. Flow Coordinator was notified. Pt is also questioning the adding the pelvis to the CT scheduled Wednesday   Reason for Disposition . Bad or foul-smelling urine  Answer Assessment - Initial Assessment Questions 1. SYMPTOM: "What's the main symptom you're concerned about?" (e.g., frequency, incontinence)     Urgency foul smelling pain  2. ONSET: "When did the  symptoms  start?"     01/01/18 after appointment and symptoms have gotten worse 3. PAIN: "Is there any pain?" If so, ask: "How bad is it?" (Scale: 1-10; mild, moderate, severe)     4 and acutely 7 for belly pain 4. CAUSE: "What do you think is causing the symptoms?"     Unknown I have flank pain 5. OTHER SYMPTOMS: "Do you have any other symptoms?" (e.g., fever, flank pain, blood in urine, pain with urination)     Flank pain mild with urination feeling of fullness 6. PREGNANCY: "Is there any chance you are pregnant?" "When was your last menstrual period?"     no  Protocols used: URINARY [facility]

## 2018-01-06 ENCOUNTER — Ambulatory Visit: Payer: 59 | Admitting: Internal Medicine

## 2018-01-06 ENCOUNTER — Encounter: Payer: Self-pay | Admitting: Internal Medicine

## 2018-01-06 VITALS — BP 122/84 | HR 98 | Temp 98.5°F | Wt 164.0 lb

## 2018-01-06 DIAGNOSIS — R3989 Other symptoms and signs involving the genitourinary system: Secondary | ICD-10-CM

## 2018-01-06 DIAGNOSIS — R350 Frequency of micturition: Secondary | ICD-10-CM

## 2018-01-06 DIAGNOSIS — R8290 Unspecified abnormal findings in urine: Secondary | ICD-10-CM

## 2018-01-06 DIAGNOSIS — R109 Unspecified abdominal pain: Secondary | ICD-10-CM

## 2018-01-06 DIAGNOSIS — R3915 Urgency of urination: Secondary | ICD-10-CM | POA: Diagnosis not present

## 2018-01-06 LAB — POC URINALSYSI DIPSTICK (AUTOMATED)
BILIRUBIN UA: NEGATIVE
Glucose, UA: NEGATIVE
KETONES UA: NEGATIVE
Nitrite, UA: POSITIVE
PH UA: 7.5 (ref 5.0–8.0)
PROTEIN UA: NEGATIVE
SPEC GRAV UA: 1.01 (ref 1.010–1.025)
Urobilinogen, UA: 0.2 E.U./dL

## 2018-01-06 MED ORDER — NITROFURANTOIN MONOHYD MACRO 100 MG PO CAPS: 100.0000 mg | ORAL_CAPSULE | Freq: Two times a day (BID) | ORAL | 0 refills | Status: DC

## 2018-01-06 NOTE — Patient Instructions (Signed)

## 2018-01-06 NOTE — Progress Notes (Signed)
HPI  Pt presents to the clinic today with c/o bladder pressure and foul smelling urine. This started 3-4 days ago. She has some urgency and frequency but denies blood in her urine. She reports nausea but no vomiting.She has been having intermittent right flank pain that has been going on for months. She describes this pain as achy. The pain radiates to her right upper quadrant. She has a CT abd/pelvis scheduled for tomorrow.   Review of Systems  Past Medical History:  Diagnosis Date  . Allergy   . Anxiety    mild  . Chronic constipation   . Endometriosis   . Ovarian cyst   . Pelvic pain in female   . Right lower quadrant pain     Family History  Problem Relation Age of Onset  . Prostate cancer Maternal Grandfather   . Brain cancer Maternal Grandfather   . Stroke Maternal Grandfather   . Endometriosis Mother   . Drug abuse Maternal Aunt   . Drug abuse Maternal Uncle   . Breast cancer Maternal Uncle   . Colon cancer Neg Hx   . Ovarian cancer Neg Hx   . Cancer Neg Hx   . Diabetes Neg Hx   . Heart disease Neg Hx     Social History   Socioeconomic History  . Marital status: Married    Spouse name: Not on file  . Number of children: Not on file  . Years of education: Not on file  . Highest education level: Not on file  Occupational History  . Not on file  Social Needs  . Financial resource strain: Not on file  . Food insecurity:    Worry: Not on file    Inability: Not on file  . Transportation needs:    Medical: Not on file    Non-medical: Not on file  Tobacco Use  . Smoking status: Never Smoker  . Smokeless tobacco: Never Used  Substance and Sexual Activity  . Alcohol use: Yes    Alcohol/week: 0.0 standard drinks    Comment: occasional  . Drug use: No  . Sexual activity: Yes    Birth control/protection: Surgical    Comment: vasctomy  Lifestyle  . Physical activity:    Days per week: 2 days    Minutes per session: 30 min  . Stress: Not on file   Relationships  . Social connections:    Talks on phone: Not on file    Gets together: Not on file    Attends religious service: Not on file    Active member of club or organization: Not on file    Attends meetings of clubs or organizations: Not on file    Relationship status: Not on file  . Intimate partner violence:    Fear of current or ex partner: Not on file    Emotionally abused: Not on file    Physically abused: Not on file    Forced sexual activity: Not on file  Other Topics Concern  . Not on file  Social History Narrative   ** Merged History Encounter **        Allergies  Allergen Reactions  . Citrus   . Shellfish Allergy   . Tape   . Metrogel [Metronidazole] Rash    ??   . Tylenol With Codeine #3 [Acetaminophen-Codeine] Rash     Constitutional: Denies fever, malaise, fatigue, headache or abrupt weight changes.   GU: Pt reports urgency, frequency, foul smelling urine and flank pain. Denies  burning sensation, blood in urine,  or discharge.   No other specific complaints in a complete review of systems (except as listed in HPI above).    Objective:   Physical Exam  LMP 12/20/2017  Wt Readings from Last 3 Encounters:  01/01/18 158 lb (71.7 kg)  07/07/17 156 lb 11.2 oz (71.1 kg)  05/12/17 156 lb 8 oz (71 kg)    General: Appears her stated age, well developed, well nourished in NAD. Abdomen: Soft. Normal bowel sounds. No distention or masses noted.  Tender to palpation over the bladder area. No CVA tenderness.        Assessment & Plan:   Urgency, Frequency, Bladder Pressure, Foul Smelling Urine and Flank Pain secondary to UTI:  Urinalysis: 1+ leuks, pos nitrites, trace blood Will send urine culture eRx sent if for Macrobid 100 mg BID x 5 days OK to take AZO OTC Drink plenty of fluids  RTC as needed or if symptoms persist. Webb Silversmith, NP

## 2018-01-07 ENCOUNTER — Ambulatory Visit (INDEPENDENT_AMBULATORY_CARE_PROVIDER_SITE_OTHER)
Admission: RE | Admit: 2018-01-07 | Discharge: 2018-01-07 | Disposition: A | Discharge status: Home / Self Care | Payer: 59 | Source: Ambulatory Visit | Attending: Internal Medicine | Admitting: Internal Medicine

## 2018-01-07 DIAGNOSIS — R1011 Right upper quadrant pain: Secondary | ICD-10-CM

## 2018-01-07 MED ORDER — IOPAMIDOL (ISOVUE-300) INJECTION 61%
100.0000 mL | Freq: Once | INTRAVENOUS | Status: AC | PRN
  Administered 2018-01-07: 100 mL via INTRAVENOUS

## 2018-01-08 LAB — URINE CULTURE
MICRO NUMBER:: 91081836
SPECIMEN QUALITY: ADEQUATE

## 2018-01-09 ENCOUNTER — Telehealth: Payer: Self-pay | Admitting: Obstetrics and Gynecology

## 2018-01-09 NOTE — Telephone Encounter (Signed)
lmtrc

## 2018-01-09 NOTE — Telephone Encounter (Signed)
The patient had a abdominopelvic CT on Wednesday, 01/07/18 and her PCP asked her to followup with Dr. Enzo Bi.  She is asking the nurse to review those images and let her know how to proceed and what the next steps are, please advise, thanks.

## 2018-01-12 ENCOUNTER — Telehealth: Payer: Self-pay | Admitting: Obstetrics and Gynecology

## 2018-01-12 NOTE — Telephone Encounter (Signed)
The patient called and stated that she missed a call from [place] and would like a call back at the earliest convenience if possible today. Please advise.

## 2018-01-12 NOTE — Telephone Encounter (Signed)
Pt aware. ASC reviewed ct scan. Small fibroid. She did not think it was the source of her pain.  Advised she could make an appt if pain persists or she had abn bleeding. Pt voices understanding.

## 2018-01-12 NOTE — Telephone Encounter (Signed)
lmtr

## 2018-01-12 NOTE — Telephone Encounter (Signed)
See previous telephone encounter.

## 2018-02-19 DIAGNOSIS — B351 Tinea unguium: Secondary | ICD-10-CM | POA: Diagnosis not present

## 2018-02-19 DIAGNOSIS — D229 Melanocytic nevi, unspecified: Secondary | ICD-10-CM | POA: Diagnosis not present

## 2018-02-19 DIAGNOSIS — Z1283 Encounter for screening for malignant neoplasm of skin: Secondary | ICD-10-CM | POA: Diagnosis not present

## 2018-08-27 DIAGNOSIS — D485 Neoplasm of uncertain behavior of skin: Secondary | ICD-10-CM | POA: Diagnosis not present

## 2018-11-16 ENCOUNTER — Encounter: Payer: Self-pay | Admitting: Internal Medicine

## 2018-11-16 ENCOUNTER — Other Ambulatory Visit: Payer: Self-pay

## 2018-11-16 ENCOUNTER — Ambulatory Visit (INDEPENDENT_AMBULATORY_CARE_PROVIDER_SITE_OTHER): Payer: 59 | Admitting: Internal Medicine

## 2018-11-16 DIAGNOSIS — J069 Acute upper respiratory infection, unspecified: Secondary | ICD-10-CM | POA: Diagnosis not present

## 2018-11-16 NOTE — Progress Notes (Signed)
Virtual Visit via Video Note  I connected with Caitlin Castillo on 11/16/18 at  9:00 AM EDT by a video enabled telemedicine application and verified that I am speaking with the correct person using two identifiers.  Location: Patient: Home Provider: Office   I discussed the limitations of evaluation and management by telemedicine and the availability of in person appointments. The patient expressed understanding and agreed to proceed.  History of Present Illness:  Patient presents for complaints of sore throat, headaches, and chills. This started 1 week ago. The headache is located in her forehead. She describes the pain as pressure. She denies associated visual changes or dizziness. She denies runny nose, nasal congestion, ear pain, cough or SOB. She reports low grade fever and chills but denies body aches. She denies nausea or vomiting but has had 2 episodes of diarrhea. She denies blood in her stool. She has not tried anything OTC for her symptoms.  She has been tested for COVID-19 and was negative. She has not had sick contacts that she is aware of.  Past Medical History:  Diagnosis Date  . Allergy   . Anxiety    mild  . Chronic constipation   . Endometriosis   . Ovarian cyst   . Pelvic pain in female   . Right lower quadrant pain     Current Outpatient Medications  Medication Sig Dispense Refill  . nitrofurantoin, macrocrystal-monohydrate, (MACROBID) 100 MG capsule Take 1 capsule (100 mg total) by mouth 2 (two) times daily. 10 capsule 0   No current facility-administered medications for this visit.     Allergies  Allergen Reactions  . Citrus   . Shellfish Allergy   . Tape   . Metrogel [Metronidazole] Rash    ??   . Tylenol With Codeine #3 [Acetaminophen-Codeine] Rash    Family History  Problem Relation Age of Onset  . Prostate cancer Maternal Grandfather   . Brain cancer Maternal Grandfather   . Stroke Maternal Grandfather   . Endometriosis Mother   . Drug abuse  Maternal Aunt   . Drug abuse Maternal Uncle   . Breast cancer Maternal Uncle   . Colon cancer Neg Hx   . Ovarian cancer Neg Hx   . Cancer Neg Hx   . Diabetes Neg Hx   . Heart disease Neg Hx     Social History   Socioeconomic History  . Marital status: Married    Spouse name: Not on file  . Number of children: Not on file  . Years of education: Not on file  . Highest education level: Not on file  Occupational History  . Not on file  Social Needs  . Financial resource strain: Not on file  . Food insecurity    Worry: Not on file    Inability: Not on file  . Transportation needs    Medical: Not on file    Non-medical: Not on file  Tobacco Use  . Smoking status: Never Smoker  . Smokeless tobacco: Never Used  Substance and Sexual Activity  . Alcohol use: Yes    Alcohol/week: 0.0 standard drinks    Comment: occasional  . Drug use: No  . Sexual activity: Yes    Birth control/protection: Surgical    Comment: vasctomy  Lifestyle  . Physical activity    Days per week: 2 days    Minutes per session: 30 min  . Stress: Not on file  Relationships  . Social Herbalist on phone:  Not on file    Gets together: Not on file    Attends religious service: Not on file    Active member of club or organization: Not on file    Attends meetings of clubs or organizations: Not on file    Relationship status: Not on file  . Intimate partner violence    Fear of current or ex partner: Not on file    Emotionally abused: Not on file    Physically abused: Not on file    Forced sexual activity: Not on file  Other Topics Concern  . Not on file  Social History Narrative   ** Merged History Encounter **         Constitutional: Pt reports headache, fever and chills. Denies malaise, fatigue,  or abrupt weight changes.  HEENT: Pt reports sore throat. Denies eye pain, eye redness, ear pain, ringing in the ears, wax buildup, runny nose, nasal congestion, bloody nose. Respiratory:  Denies difficulty breathing, shortness of breath, cough or sputum production.   Cardiovascular: Denies chest pain, chest tightness, palpitations or swelling in the hands or feet.  Gastrointestinal: Pt reports diarrhea. Denies abdominal pain, bloating, constipation, or blood in the stool.   No other specific complaints in a complete review of systems (except as listed in HPI above).     Observations/Objective:   Wt Readings from Last 3 Encounters:  08/06/18 160 lb (72.6 kg)  01/06/18 164 lb (74.4 kg)  01/01/18 158 lb (71.7 kg)    General: Appears her stated age, well developed, well nourished in NAD. Skin: Warm, dry and intact. No rashes noted. HEENT: Head: normal shape and size; Eyes: sclera white, no icterus, conjunctiva pink, PERRLA and EOMs intact;  Pulmonary/Chest: Normal effort. No respiratory distress.  Neurological: Alert and oriented.    BMET    Component Value Date/Time   NA 138 01/01/2018 1019   K 4.1 01/01/2018 1019   CL 103 01/01/2018 1019   CO2 30 01/01/2018 1019   GLUCOSE 97 01/01/2018 1019   BUN 14 01/01/2018 1019   CREATININE 0.84 01/01/2018 1019   CALCIUM 9.3 01/01/2018 1019    Lipid Panel     Component Value Date/Time   CHOL 138 05/28/2016 1026   TRIG 63 05/28/2016 1026   HDL 53 05/28/2016 1026   CHOLHDL 2.6 05/28/2016 1026   LDLCALC 72 05/28/2016 1026    CBC    Component Value Date/Time   WBC 11.9 (H) 02/14/2012 0359   RBC 4.29 02/14/2012 0359   HGB 11.7 (L) 02/15/2012 0604   HCT 35.1 02/14/2012 0359   PLT 190 02/14/2012 0359   MCV 82 02/14/2012 0359   MCH 27.2 02/14/2012 0359   MCHC 33.2 02/14/2012 0359   RDW 13.9 02/14/2012 0359   LYMPHSABS 2.3 02/14/2012 0359   MONOABS 0.6 02/14/2012 0359   EOSABS 0.0 02/14/2012 0359   BASOSABS 0.0 02/14/2012 0359    Hgb A1C Lab Results  Component Value Date   HGBA1C 5.0 05/28/2016       Assessment and Plan:  URI:  Exam benign and she is feeling better COVID 19 negative Would  recommend Zyrtec and Flonase OTC Tylenol/Ibuprofen for fever Continue to monitor Rest and push fluids  Return precautions discussed  Follow Up Instructions:    I discussed the assessment and treatment plan with the patient. The patient was provided an opportunity to ask questions and all were answered. The patient agreed with the plan and demonstrated an understanding of the instructions.   The  patient was advised to call back or seek an in-person evaluation if the symptoms worsen or if the condition fails to improve as anticipated.   Webb Silversmith, NP

## 2018-11-17 NOTE — Patient Instructions (Signed)
Upper Respiratory Infection, Adult An upper respiratory infection (URI) affects the nose, throat, and upper air passages. URIs are caused by germs (viruses). The most common type of URI is often called "the common cold." Medicines cannot cure URIs, but you can do things at home to relieve your symptoms. URIs usually get better within 7-10 days. Follow these instructions at home: Activity  Rest as needed.  If you have a fever, stay home from work or school until your fever is gone, or until your doctor says you may return to work or school. ? You should stay home until you cannot spread the infection anymore (you are not contagious). ? Your doctor may have you wear a face mask so you have less risk of spreading the infection. Relieving symptoms  Gargle with a salt-water mixture 3-4 times a day or as needed. To make a salt-water mixture, completely dissolve -1 tsp of salt in 1 cup of warm water.  Use a cool-mist humidifier to add moisture to the air. This can help you breathe more easily. Eating and drinking   Drink enough fluid to keep your pee (urine) pale yellow.  Eat soups and other clear broths. General instructions   Take over-the-counter and prescription medicines only as told by your doctor. These include cold medicines, fever reducers, and cough suppressants.  Do not use any products that contain nicotine or tobacco. These include cigarettes and e-cigarettes. If you need help quitting, ask your doctor.  Avoid being where people are smoking (avoid secondhand smoke).  Make sure you get regular shots and get the flu shot every year.  Keep all follow-up visits as told by your doctor. This is important. How to avoid spreading infection to others   Wash your hands often with soap and water. If you do not have soap and water, use hand sanitizer.  Avoid touching your mouth, face, eyes, or nose.  Cough or sneeze into a tissue or your sleeve or elbow. Do not cough or sneeze  into your hand or into the air. Contact a doctor if:  You are getting worse, not better.  You have any of these: ? A fever. ? Chills. ? Brown or red mucus in your nose. ? Yellow or brown fluid (discharge)coming from your nose. ? Pain in your face, especially when you bend forward. ? Swollen neck glands. ? Pain with swallowing. ? White areas in the back of your throat. Get help right away if:  You have shortness of breath that gets worse.  You have very bad or constant: ? Headache. ? Ear pain. ? Pain in your forehead, behind your eyes, and over your cheekbones (sinus pain). ? Chest pain.  You have long-lasting (chronic) lung disease along with any of these: ? Wheezing. ? Long-lasting cough. ? Coughing up blood. ? A change in your usual mucus.  You have a stiff neck.  You have changes in your: ? Vision. ? Hearing. ? Thinking. ? Mood. Summary  An upper respiratory infection (URI) is caused by a germ called a virus. The most common type of URI is often called "the common cold."  URIs usually get better within 7-10 days.  Take over-the-counter and prescription medicines only as told by your doctor. This information is not intended to replace advice given to you by your health care provider. Make sure you discuss any questions you have with your health care provider. Document Released: 10/02/2007 Document Revised: 04/23/2018 Document Reviewed: 12/06/2016 Elsevier Patient Education  2020 [employer].

## 2018-12-01 ENCOUNTER — Ambulatory Visit (INDEPENDENT_AMBULATORY_CARE_PROVIDER_SITE_OTHER): Payer: 59 | Admitting: Obstetrics and Gynecology

## 2018-12-01 ENCOUNTER — Encounter: Payer: Self-pay | Admitting: Obstetrics and Gynecology

## 2018-12-01 ENCOUNTER — Other Ambulatory Visit: Payer: Self-pay

## 2018-12-01 VITALS — BP 134/72 | HR 69 | Ht 68.0 in | Wt 172.2 lb

## 2018-12-01 DIAGNOSIS — Z1239 Encounter for other screening for malignant neoplasm of breast: Secondary | ICD-10-CM | POA: Diagnosis not present

## 2018-12-01 DIAGNOSIS — N920 Excessive and frequent menstruation with regular cycle: Secondary | ICD-10-CM

## 2018-12-01 DIAGNOSIS — Z01419 Encounter for gynecological examination (general) (routine) without abnormal findings: Secondary | ICD-10-CM | POA: Diagnosis not present

## 2018-12-01 NOTE — Progress Notes (Signed)
Patient comes in today for annual exam. She has no concerns today. Labs and mammogram ordered.

## 2018-12-01 NOTE — Progress Notes (Signed)
HPI:      [name] is a 43 y.o. [id number] who LMP was Patient's last menstrual period was 11/13/2018.  Subjective:   She presents today for her annual examination.  She has been dealing with heavy menstrual periods for a while.  She states that her periods are approximately 8 days long 6 of which are heavy.  She thinks she may have previously had a CT showing uterine fibroids. Her husband has a vasectomy for birth control. She has never had a mammogram.    Hx: The following portions of the patient's history were reviewed and updated as appropriate:             She  has a past medical history of Allergy, Anxiety, Chronic constipation, Endometriosis, Ovarian cyst, Pelvic pain in female, and Right lower quadrant pain. She does not have any pertinent problems on file. She  has a past surgical history that includes Tonsillectomy and adenoidectomy and Wisdom tooth extraction. Her family history includes Brain cancer in her maternal grandfather; Breast cancer in her maternal uncle; Drug abuse in her maternal aunt and maternal uncle; Endometriosis in her mother; Prostate cancer in her maternal grandfather; Stroke in her maternal grandfather. She  reports that she has never smoked. She has never used smokeless tobacco. She reports current alcohol use. She reports that she does not use drugs. She currently has no medications in their medication list. She is allergic to citrus; shellfish allergy; tape; metrogel [metronidazole]; and tylenol with codeine #3 [acetaminophen-codeine].       Review of Systems:  Review of Systems  Constitutional: Denied constitutional symptoms, night sweats, recent illness, fatigue, fever, insomnia and weight loss.  Eyes: Denied eye symptoms, eye pain, photophobia, vision change and visual disturbance.  Ears/Nose/Throat/Neck: Denied ear, nose, throat or neck symptoms, hearing loss, nasal discharge, sinus congestion and sore throat.  Cardiovascular: Denied  cardiovascular symptoms, arrhythmia, chest pain/pressure, edema, exercise intolerance, orthopnea and palpitations.  Respiratory: Denied pulmonary symptoms, asthma, pleuritic pain, productive sputum, cough, dyspnea and wheezing.  Gastrointestinal: Denied, gastro-esophageal reflux, melena, nausea and vomiting.  Genitourinary: See HPI for additional information.  Musculoskeletal: Denied musculoskeletal symptoms, stiffness, swelling, muscle weakness and myalgia.  Dermatologic: Denied dermatology symptoms, rash and scar.  Neurologic: Denied neurology symptoms, dizziness, headache, neck pain and syncope.  Psychiatric: Denied psychiatric symptoms, anxiety and depression.  Endocrine: Denied endocrine symptoms including hot flashes and night sweats.   Meds:   No current outpatient medications on file prior to visit.   No current facility-administered medications on file prior to visit.     Objective:     Vitals:   12/01/18 0809  BP: 134/72  Pulse: 69              Physical examination General NAD, Conversant  HEENT Atraumatic; Op clear with mmm.  Normo-cephalic. Pupils reactive. Anicteric sclerae  Thyroid/Neck Smooth without nodularity or enlargement. Normal ROM.  Neck Supple.  Skin No rashes, lesions or ulceration. Normal palpated skin turgor. No nodularity.  Breasts: No masses or discharge.  Symmetric.  No axillary adenopathy.  Lungs: Clear to auscultation.No rales or wheezes. Normal Respiratory effort, no retractions.  Heart: NSR.  No murmurs or rubs appreciated. No periferal edema  Abdomen: Soft.  Non-tender.  No masses.  No HSM. No hernia  Extremities: Moves all appropriately.  Normal ROM for age. No lymphadenopathy.  Neuro: Oriented to PPT.  Normal mood. Normal affect.     Pelvic:   Vulva: Normal appearance.  No lesions.  Vagina: No lesions or abnormalities noted.  Support: Normal pelvic support.  Urethra No masses tenderness or scarring.  Meatus Normal size without lesions or  prolapse.  Cervix: Normal appearance.  No lesions.  Anus: Normal exam.  No lesions.  Perineum: Normal exam.  No lesions.        Bimanual   Uterus:  Approximately 10 weeks size slightly tender- filling the pelvis.  Mobile.  AV.  Adnexae: No masses.  Non-tender to palpation.  Cul-de-sac: Negative for abnormality.      Assessment:    [id number] Patient Active Problem List   Diagnosis Date Noted  . Seasonal allergies 03/20/2015  . GERD (gastroesophageal reflux disease) 03/20/2015  . Atrial tachycardia ([place]) 11/25/2011     1. Well woman exam with routine gynecological exam   2. Screening for breast cancer   3. Menorrhagia with regular cycle     Heavy bleeding most likely secondary to uterine fibroids.  At this time the patient is dealing with this but she is not sure if she would like to continue to do it until menopause.   Plan:            1.  Basic Screening Recommendations The basic screening recommendations for asymptomatic women were discussed with the patient during her visit.  The age-appropriate recommendations were discussed with her and the rational for the tests reviewed.  When I am informed by the patient that another primary care physician has previously obtained the age-appropriate tests and they are up-to-date, only outstanding tests are ordered and referrals given as necessary.  Abnormal results of tests will be discussed with her when all of her results are completed. Lab work ordered-mammogram ordered 2.  We have discussed uterine fibroids in detail management with IUD and OCPs discussed.  Work-up to include ultrasound to rule out submucosal fibroids if patient considering IUD.  IUD discussed as well.  Patient will inform us if she would like to do something about cycle control. Orders Orders Placed This Encounter  Procedures  . MM 3D SCREEN BREAST BILATERAL  . Lipid panel  . Hemoglobin A1c  . TSH    No orders of the defined types were placed in this encounter.        F/U  Return in about 1 year (around 12/01/2019) for Pt to contact us if symptoms worsen.  Finis Bud, M.D. 12/01/2018 8:43 AM

## 2018-12-02 LAB — LIPID PANEL
Chol/HDL Ratio: 2.3 ratio (ref 0.0–4.4)
Cholesterol, Total: 140 mg/dL (ref 100–199)
HDL: 62 mg/dL (ref >39)
LDL Calculated: 69 mg/dL (ref 0–99)
Triglycerides: 44 mg/dL (ref 0–149)
VLDL Cholesterol Cal: 9 mg/dL (ref 5–40)

## 2018-12-02 LAB — HEMOGLOBIN A1C
Est. average glucose Bld gHb Est-mCnc: 100 mg/dL
Hgb A1c MFr Bld: 5.1 % (ref 4.8–5.6)

## 2018-12-02 LAB — TSH: TSH: 3.12 u[IU]/mL (ref 0.450–4.500)

## 2019-03-02 ENCOUNTER — Other Ambulatory Visit: Payer: Self-pay

## 2019-03-02 DIAGNOSIS — Z20822 Contact with and (suspected) exposure to covid-19: Secondary | ICD-10-CM

## 2019-03-16 ENCOUNTER — Encounter: Payer: Self-pay | Admitting: Internal Medicine

## 2019-04-13 ENCOUNTER — Encounter: Payer: Self-pay | Admitting: Obstetrics and Gynecology

## 2019-04-13 ENCOUNTER — Other Ambulatory Visit: Payer: Self-pay

## 2019-04-13 ENCOUNTER — Ambulatory Visit: Payer: 59 | Admitting: Obstetrics and Gynecology

## 2019-04-13 VITALS — BP 131/74 | HR 103 | Ht 68.0 in | Wt 176.6 lb

## 2019-04-13 DIAGNOSIS — Z3169 Encounter for other general counseling and advice on procreation: Secondary | ICD-10-CM

## 2019-04-13 DIAGNOSIS — D219 Benign neoplasm of connective and other soft tissue, unspecified: Secondary | ICD-10-CM | POA: Diagnosis not present

## 2019-04-13 DIAGNOSIS — O09529 Supervision of elderly multigravida, unspecified trimester: Secondary | ICD-10-CM | POA: Diagnosis not present

## 2019-04-13 NOTE — Progress Notes (Signed)
HPI:      [name] is a 43 y.o. [id number] who LMP was Patient's last menstrual period was 04/08/2019.  Subjective:   She presents today for preconceptual counseling.  She has several things that she would like to address. 1.  Her husband has a vasectomy and he is considering vasectomy reversal.  Before that is done she had inquiries regarding her own medical condition. 2.  Patient has a known uterine fibroid 3.  History of macrosomic infant 4.  Advanced maternal age  Of significant note patient is having normal regular menstrual cycles-1/month.    Hx: The following portions of the patient's history were reviewed and updated as appropriate:             She  has a past medical history of Allergy, Anxiety, Chronic constipation, Endometriosis, Ovarian cyst, Pelvic pain in female, and Right lower quadrant pain. She does not have any pertinent problems on file. She  has a past surgical history that includes Tonsillectomy and adenoidectomy and Wisdom tooth extraction. Her family history includes Brain cancer in her maternal grandfather; Breast cancer in her maternal uncle; Drug abuse in her maternal aunt and maternal uncle; Endometriosis in her mother; Prostate cancer in her maternal grandfather; Stroke in her maternal grandfather. She  reports that she has never smoked. She has never used smokeless tobacco. She reports current alcohol use. She reports that she does not use drugs. She currently has no medications in their medication list. She is allergic to citrus; shellfish allergy; tape; metrogel [metronidazole]; and tylenol with codeine #3 [acetaminophen-codeine].       Review of Systems:  Review of Systems  Constitutional: Denied constitutional symptoms, night sweats, recent illness, fatigue, fever, insomnia and weight loss.  Eyes: Denied eye symptoms, eye pain, photophobia, vision change and visual disturbance.  Ears/Nose/Throat/Neck: Denied ear, nose, throat or neck symptoms,  hearing loss, nasal discharge, sinus congestion and sore throat.  Cardiovascular: Denied cardiovascular symptoms, arrhythmia, chest pain/pressure, edema, exercise intolerance, orthopnea and palpitations.  Respiratory: Denied pulmonary symptoms, asthma, pleuritic pain, productive sputum, cough, dyspnea and wheezing.  Gastrointestinal: Denied, gastro-esophageal reflux, melena, nausea and vomiting.  Genitourinary: Denied genitourinary symptoms including symptomatic vaginal discharge, pelvic relaxation issues, and urinary complaints.  Musculoskeletal: Denied musculoskeletal symptoms, stiffness, swelling, muscle weakness and myalgia.  Dermatologic: Denied dermatology symptoms, rash and scar.  Neurologic: Denied neurology symptoms, dizziness, headache, neck pain and syncope.  Psychiatric: Denied psychiatric symptoms, anxiety and depression.  Endocrine: Denied endocrine symptoms including hot flashes and night sweats.   Meds:   No current outpatient medications on file prior to visit.   No current facility-administered medications on file prior to visit.    Objective:     Vitals:   04/13/19 1424  BP: 131/74  Pulse: (!) 103                Assessment:    [id number] Patient Active Problem List   Diagnosis Date Noted  . Seasonal allergies 03/20/2015  . GERD (gastroesophageal reflux disease) 03/20/2015  . Atrial tachycardia ([place]) 11/25/2011     1. Fibroids   2. Encounter for preconception consultation   3. Antepartum multigravida of advanced maternal age     She is likely ovulatory based on her menstrual cycle history and her previous ability to become pregnant.  We have discussed fibroids in pregnancy in detail and I have informed her that they are a very rare cause of infertility and that many people have normal successful pregnancies despite the  presence of fibroids.  Following ultrasounds for growth was discussed.  Advanced maternal age and chromosomal issues specifically Down  syndrome was discussed.  Her risk of approximately 1 and 40 was reviewed and discussed in detail.  Chromosomal testing in the late first trimester was also discussed.  Use of prenatal vitamins and folic acid prior to conception discussed.  Possibly some decreased fertility based on age and possibly decreased fertility based on husband's subsequent sperm count after vasectomy reversal.  Use of REI and artificial insemination by concentrating husband's sperm count if necessary discussed.   Plan:            1.  See above discussions patient will consider her options and if she has any further questions she will let us know. Orders No orders of the defined types were placed in this encounter.   No orders of the defined types were placed in this encounter.     F/U  No follow-ups on file. I spent 28 minutes involved in the care of this patient of which greater than 50% was spent discussing above issues noted in assessment.  All questions answered.  Finis Bud, M.D. 04/13/2019 4:33 PM

## 2019-04-20 ENCOUNTER — Other Ambulatory Visit: Payer: Self-pay | Admitting: Obstetrics and Gynecology

## 2019-04-20 ENCOUNTER — Ambulatory Visit
Admission: RE | Admit: 2019-04-20 | Discharge: 2019-04-20 | Disposition: A | Discharge status: Home / Self Care | Payer: 59 | Source: Ambulatory Visit | Attending: Obstetrics and Gynecology | Admitting: Obstetrics and Gynecology

## 2019-04-20 DIAGNOSIS — R928 Other abnormal and inconclusive findings on diagnostic imaging of breast: Secondary | ICD-10-CM | POA: Diagnosis not present

## 2019-04-20 DIAGNOSIS — R921 Mammographic calcification found on diagnostic imaging of breast: Secondary | ICD-10-CM

## 2019-04-20 DIAGNOSIS — Z1231 Encounter for screening mammogram for malignant neoplasm of breast: Secondary | ICD-10-CM | POA: Diagnosis present

## 2019-04-20 DIAGNOSIS — Z1239 Encounter for other screening for malignant neoplasm of breast: Secondary | ICD-10-CM

## 2019-04-20 DIAGNOSIS — N6310 Unspecified lump in the right breast, unspecified quadrant: Secondary | ICD-10-CM

## 2019-05-03 ENCOUNTER — Ambulatory Visit
Admission: RE | Admit: 2019-05-03 | Discharge: 2019-05-03 | Disposition: A | Discharge status: Home / Self Care | Payer: 59 | Source: Ambulatory Visit | Attending: Obstetrics and Gynecology | Admitting: Obstetrics and Gynecology

## 2019-05-03 ENCOUNTER — Other Ambulatory Visit: Payer: Self-pay | Admitting: Obstetrics and Gynecology

## 2019-05-03 DIAGNOSIS — N6310 Unspecified lump in the right breast, unspecified quadrant: Secondary | ICD-10-CM | POA: Diagnosis present

## 2019-05-03 DIAGNOSIS — R928 Other abnormal and inconclusive findings on diagnostic imaging of breast: Secondary | ICD-10-CM

## 2019-05-03 DIAGNOSIS — R921 Mammographic calcification found on diagnostic imaging of breast: Secondary | ICD-10-CM

## 2019-05-03 HISTORY — DX: Diffuse cystic mastopathy of unspecified breast: N60.19

## 2019-05-13 ENCOUNTER — Ambulatory Visit
Admission: RE | Admit: 2019-05-13 | Discharge: 2019-05-13 | Disposition: A | Discharge status: Home / Self Care | Payer: 59 | Source: Ambulatory Visit | Attending: Obstetrics and Gynecology | Admitting: Obstetrics and Gynecology

## 2019-05-13 DIAGNOSIS — R928 Other abnormal and inconclusive findings on diagnostic imaging of breast: Secondary | ICD-10-CM

## 2019-05-13 DIAGNOSIS — R921 Mammographic calcification found on diagnostic imaging of breast: Secondary | ICD-10-CM

## 2019-05-13 HISTORY — PX: BREAST BIOPSY: SHX20

## 2019-05-14 ENCOUNTER — Telehealth: Payer: Self-pay | Admitting: Obstetrics and Gynecology

## 2019-05-14 LAB — SURGICAL PATHOLOGY

## 2019-05-14 NOTE — Telephone Encounter (Signed)
Pt called in and stated that she was waiting for a call on her results but pt received a letter. Pt is requesting a call back. Please advise

## 2019-05-17 NOTE — Telephone Encounter (Signed)
LM for patient to return call.

## 2019-05-17 NOTE — Telephone Encounter (Signed)
Patient is requesting results from her breast biopsy. Please advise.

## 2019-05-25 NOTE — Telephone Encounter (Signed)
LM for patient to return call.

## 2019-05-28 ENCOUNTER — Ambulatory Visit: Payer: Self-pay | Admitting: General Surgery

## 2019-05-28 DIAGNOSIS — N6021 Fibroadenosis of right breast: Secondary | ICD-10-CM

## 2019-05-31 NOTE — Telephone Encounter (Signed)
LM to return call.

## 2019-06-02 ENCOUNTER — Other Ambulatory Visit: Payer: Self-pay | Admitting: General Surgery

## 2019-06-02 ENCOUNTER — Ambulatory Visit: Payer: Self-pay | Admitting: General Surgery

## 2019-06-02 DIAGNOSIS — N6021 Fibroadenosis of right breast: Secondary | ICD-10-CM

## 2019-06-10 NOTE — Telephone Encounter (Signed)
Tried to call patient again. Patient has not returned calls.

## 2019-07-14 ENCOUNTER — Other Ambulatory Visit: Payer: Self-pay

## 2019-07-14 ENCOUNTER — Encounter (HOSPITAL_BASED_OUTPATIENT_CLINIC_OR_DEPARTMENT_OTHER): Payer: Self-pay | Admitting: General Surgery

## 2019-07-20 ENCOUNTER — Other Ambulatory Visit (HOSPITAL_COMMUNITY)
Admission: RE | Admit: 2019-07-20 | Discharge: 2019-07-20 | Disposition: A | Discharge status: Home / Self Care | Payer: 59 | Source: Ambulatory Visit | Attending: General Surgery | Admitting: General Surgery

## 2019-07-20 DIAGNOSIS — Z20822 Contact with and (suspected) exposure to covid-19: Secondary | ICD-10-CM | POA: Diagnosis not present

## 2019-07-20 DIAGNOSIS — Z87891 Personal history of nicotine dependence: Secondary | ICD-10-CM | POA: Diagnosis not present

## 2019-07-20 DIAGNOSIS — K219 Gastro-esophageal reflux disease without esophagitis: Secondary | ICD-10-CM | POA: Diagnosis not present

## 2019-07-20 DIAGNOSIS — N6489 Other specified disorders of breast: Secondary | ICD-10-CM | POA: Diagnosis present

## 2019-07-20 LAB — SARS CORONAVIRUS 2 (TAT 6-24 HRS): SARS Coronavirus 2: NEGATIVE

## 2019-07-20 MED ORDER — CHLORHEXIDINE GLUCONATE CLOTH 2 % EX PADS: 6.0000 | MEDICATED_PAD | Freq: Once | CUTANEOUS | Status: DC

## 2019-07-20 NOTE — Progress Notes (Signed)

## 2019-07-22 ENCOUNTER — Other Ambulatory Visit: Payer: Self-pay

## 2019-07-22 ENCOUNTER — Ambulatory Visit
Admission: RE | Admit: 2019-07-22 | Discharge: 2019-07-22 | Disposition: A | Discharge status: Home / Self Care | Payer: 59 | Source: Ambulatory Visit | Attending: General Surgery | Admitting: General Surgery

## 2019-07-22 DIAGNOSIS — N6021 Fibroadenosis of right breast: Secondary | ICD-10-CM

## 2019-07-23 ENCOUNTER — Ambulatory Visit (HOSPITAL_BASED_OUTPATIENT_CLINIC_OR_DEPARTMENT_OTHER)
Admission: RE | Admit: 2019-07-23 | Discharge: 2019-07-23 | Disposition: A | Discharge status: Home / Self Care | Payer: 59 | Attending: General Surgery | Admitting: General Surgery

## 2019-07-23 ENCOUNTER — Encounter (HOSPITAL_BASED_OUTPATIENT_CLINIC_OR_DEPARTMENT_OTHER)
Admission: RE | Disposition: A | Discharge status: Home / Self Care | Payer: Self-pay | Source: Home / Self Care | Attending: General Surgery

## 2019-07-23 ENCOUNTER — Encounter (HOSPITAL_BASED_OUTPATIENT_CLINIC_OR_DEPARTMENT_OTHER): Payer: Self-pay | Admitting: General Surgery

## 2019-07-23 ENCOUNTER — Ambulatory Visit
Admission: RE | Admit: 2019-07-23 | Discharge: 2019-07-23 | Disposition: A | Discharge status: Home / Self Care | Payer: 59 | Source: Ambulatory Visit | Attending: General Surgery | Admitting: General Surgery

## 2019-07-23 ENCOUNTER — Other Ambulatory Visit: Payer: Self-pay

## 2019-07-23 ENCOUNTER — Ambulatory Visit (HOSPITAL_BASED_OUTPATIENT_CLINIC_OR_DEPARTMENT_OTHER): Payer: 59 | Admitting: Certified Registered"

## 2019-07-23 DIAGNOSIS — N6489 Other specified disorders of breast: Secondary | ICD-10-CM | POA: Insufficient documentation

## 2019-07-23 DIAGNOSIS — K219 Gastro-esophageal reflux disease without esophagitis: Secondary | ICD-10-CM | POA: Insufficient documentation

## 2019-07-23 DIAGNOSIS — Z20822 Contact with and (suspected) exposure to covid-19: Secondary | ICD-10-CM | POA: Insufficient documentation

## 2019-07-23 DIAGNOSIS — N6021 Fibroadenosis of right breast: Secondary | ICD-10-CM

## 2019-07-23 DIAGNOSIS — Z87891 Personal history of nicotine dependence: Secondary | ICD-10-CM | POA: Insufficient documentation

## 2019-07-23 HISTORY — PX: BREAST LUMPECTOMY WITH RADIOACTIVE SEED LOCALIZATION: SHX6424

## 2019-07-23 HISTORY — PX: BREAST SURGERY: SHX581

## 2019-07-23 LAB — POCT PREGNANCY, URINE: Preg Test, Ur: NEGATIVE

## 2019-07-23 SURGERY — BREAST LUMPECTOMY WITH RADIOACTIVE SEED LOCALIZATION
Anesthesia: General | Site: Breast | Laterality: Right

## 2019-07-23 MED ORDER — GABAPENTIN 300 MG PO CAPS
300.0000 mg | ORAL_CAPSULE | ORAL | Status: AC
  Administered 2019-07-23: 300 mg via ORAL

## 2019-07-23 MED ORDER — KETOROLAC TROMETHAMINE 30 MG/ML IJ SOLN: 30.0000 mg | Freq: Once | INTRAMUSCULAR | Status: DC | PRN

## 2019-07-23 MED ORDER — ONDANSETRON HCL 4 MG/2ML IJ SOLN
INTRAMUSCULAR | Status: AC
  Filled 2019-07-23: qty 12

## 2019-07-23 MED ORDER — ACETAMINOPHEN 500 MG PO TABS
1000.0000 mg | ORAL_TABLET | Freq: Once | ORAL | Status: AC
  Administered 2019-07-23: 1000 mg via ORAL

## 2019-07-23 MED ORDER — CEFAZOLIN SODIUM-DEXTROSE 2-4 GM/100ML-% IV SOLN
2.0000 g | INTRAVENOUS | Status: AC
  Administered 2019-07-23: 2 g via INTRAVENOUS

## 2019-07-23 MED ORDER — LIDOCAINE 2% (20 MG/ML) 5 ML SYRINGE
INTRAMUSCULAR | Status: AC
  Filled 2019-07-23: qty 15

## 2019-07-23 MED ORDER — LACTATED RINGERS IV SOLN: INTRAVENOUS | Status: DC

## 2019-07-23 MED ORDER — GABAPENTIN 300 MG PO CAPS: 300.0000 mg | ORAL_CAPSULE | ORAL | Status: DC

## 2019-07-23 MED ORDER — LIDOCAINE HCL (CARDIAC) PF 100 MG/5ML IV SOSY
PREFILLED_SYRINGE | INTRAVENOUS | Status: DC | PRN
  Administered 2019-07-23: 60 mg via INTRAVENOUS

## 2019-07-23 MED ORDER — HYDROMORPHONE HCL 1 MG/ML IJ SOLN
0.2500 mg | INTRAMUSCULAR | Status: DC | PRN
  Administered 2019-07-23: 0.25 mg via INTRAVENOUS

## 2019-07-23 MED ORDER — OXYCODONE HCL 5 MG PO TABS
ORAL_TABLET | ORAL | Status: AC
  Filled 2019-07-23: qty 1

## 2019-07-23 MED ORDER — MIDAZOLAM HCL 2 MG/2ML IJ SOLN
INTRAMUSCULAR | Status: AC
  Filled 2019-07-23: qty 2

## 2019-07-23 MED ORDER — CHLORHEXIDINE GLUCONATE CLOTH 2 % EX PADS: 6.0000 | MEDICATED_PAD | Freq: Once | CUTANEOUS | Status: DC

## 2019-07-23 MED ORDER — FENTANYL CITRATE (PF) 100 MCG/2ML IJ SOLN: 50.0000 ug | INTRAMUSCULAR | Status: DC | PRN

## 2019-07-23 MED ORDER — ACETAMINOPHEN 500 MG PO TABS
ORAL_TABLET | ORAL | Status: AC
  Filled 2019-07-23: qty 2

## 2019-07-23 MED ORDER — MIDAZOLAM HCL 5 MG/5ML IJ SOLN
INTRAMUSCULAR | Status: DC | PRN
  Administered 2019-07-23: 2 mg via INTRAVENOUS

## 2019-07-23 MED ORDER — PROPOFOL 500 MG/50ML IV EMUL
INTRAVENOUS | Status: AC
  Filled 2019-07-23: qty 100

## 2019-07-23 MED ORDER — FENTANYL CITRATE (PF) 100 MCG/2ML IJ SOLN
INTRAMUSCULAR | Status: AC
  Filled 2019-07-23: qty 2

## 2019-07-23 MED ORDER — PROPOFOL 500 MG/50ML IV EMUL
INTRAVENOUS | Status: DC | PRN
  Administered 2019-07-23: 25 ug/kg/min via INTRAVENOUS

## 2019-07-23 MED ORDER — PROMETHAZINE HCL 25 MG/ML IJ SOLN: 6.2500 mg | INTRAMUSCULAR | Status: DC | PRN

## 2019-07-23 MED ORDER — DEXAMETHASONE SODIUM PHOSPHATE 4 MG/ML IJ SOLN
INTRAMUSCULAR | Status: DC | PRN
  Administered 2019-07-23: 4 mg via INTRAVENOUS

## 2019-07-23 MED ORDER — PROPOFOL 10 MG/ML IV BOLUS
INTRAVENOUS | Status: DC | PRN
  Administered 2019-07-23: 200 mg via INTRAVENOUS

## 2019-07-23 MED ORDER — ONDANSETRON HCL 4 MG/2ML IJ SOLN
INTRAMUSCULAR | Status: DC | PRN
  Administered 2019-07-23: 4 mg via INTRAVENOUS

## 2019-07-23 MED ORDER — CELECOXIB 200 MG PO CAPS: 200.0000 mg | ORAL_CAPSULE | ORAL | Status: DC

## 2019-07-23 MED ORDER — HYDROMORPHONE HCL 1 MG/ML IJ SOLN
INTRAMUSCULAR | Status: AC
  Filled 2019-07-23: qty 0.5

## 2019-07-23 MED ORDER — CEFAZOLIN SODIUM-DEXTROSE 2-4 GM/100ML-% IV SOLN: 2.0000 g | INTRAVENOUS | Status: DC

## 2019-07-23 MED ORDER — CEFAZOLIN SODIUM-DEXTROSE 2-4 GM/100ML-% IV SOLN
INTRAVENOUS | Status: AC
  Filled 2019-07-23: qty 100

## 2019-07-23 MED ORDER — MIDAZOLAM HCL 2 MG/2ML IJ SOLN: 1.0000 mg | INTRAMUSCULAR | Status: DC | PRN

## 2019-07-23 MED ORDER — MEPERIDINE HCL 25 MG/ML IJ SOLN: 6.2500 mg | INTRAMUSCULAR | Status: DC | PRN

## 2019-07-23 MED ORDER — OXYCODONE HCL 5 MG PO TABS
5.0000 mg | ORAL_TABLET | Freq: Once | ORAL | Status: AC | PRN
  Administered 2019-07-23: 5 mg via ORAL

## 2019-07-23 MED ORDER — HYDROCODONE-ACETAMINOPHEN 5-325 MG PO TABS: 1.0000 | ORAL_TABLET | Freq: Four times a day (QID) | ORAL | 0 refills | Status: DC | PRN

## 2019-07-23 MED ORDER — FENTANYL CITRATE (PF) 100 MCG/2ML IJ SOLN
INTRAMUSCULAR | Status: DC | PRN
  Administered 2019-07-23 (×2): 50 ug via INTRAVENOUS

## 2019-07-23 MED ORDER — BUPIVACAINE-EPINEPHRINE (PF) 0.25% -1:200000 IJ SOLN
INTRAMUSCULAR | Status: DC | PRN
  Administered 2019-07-23: 20 mL via PERINEURAL

## 2019-07-23 MED ORDER — CELECOXIB 200 MG PO CAPS
ORAL_CAPSULE | ORAL | Status: AC
  Filled 2019-07-23: qty 1

## 2019-07-23 MED ORDER — CELECOXIB 200 MG PO CAPS
200.0000 mg | ORAL_CAPSULE | ORAL | Status: AC
  Administered 2019-07-23: 200 mg via ORAL

## 2019-07-23 MED ORDER — GABAPENTIN 300 MG PO CAPS
ORAL_CAPSULE | ORAL | Status: AC
  Filled 2019-07-23: qty 1

## 2019-07-23 MED ORDER — DEXAMETHASONE SODIUM PHOSPHATE 10 MG/ML IJ SOLN
INTRAMUSCULAR | Status: AC
  Filled 2019-07-23: qty 2

## 2019-07-23 MED ORDER — OXYCODONE HCL 5 MG/5ML PO SOLN: 5.0000 mg | Freq: Once | ORAL | Status: AC | PRN

## 2019-07-23 SURGICAL SUPPLY — 44 items
APPLIER CLIP 9.375 MED OPEN (MISCELLANEOUS)
BLADE SURG 15 STRL LF DISP TIS (BLADE) ×1 IMPLANT
BLADE SURG 15 STRL SS (BLADE) ×2
CANISTER SUC SOCK COL 7IN (MISCELLANEOUS) ×3 IMPLANT
CANISTER SUCT 1200ML W/VALVE (MISCELLANEOUS) ×3 IMPLANT
CHLORAPREP W/TINT 26 (MISCELLANEOUS) ×3 IMPLANT
CLIP APPLIE 9.375 MED OPEN (MISCELLANEOUS) IMPLANT
COVER BACK TABLE 60X90IN (DRAPES) ×3 IMPLANT
COVER MAYO STAND STRL (DRAPES) ×3 IMPLANT
COVER PROBE W GEL 5X96 (DRAPES) ×3 IMPLANT
COVER WAND RF STERILE (DRAPES) IMPLANT
DECANTER SPIKE VIAL GLASS SM (MISCELLANEOUS) IMPLANT
DERMABOND ADVANCED (GAUZE/BANDAGES/DRESSINGS) ×2
DERMABOND ADVANCED .7 DNX12 (GAUZE/BANDAGES/DRESSINGS) ×1 IMPLANT
DRAPE LAPAROSCOPIC ABDOMINAL (DRAPES) ×3 IMPLANT
DRAPE UTILITY XL STRL (DRAPES) ×3 IMPLANT
ELECT COATED BLADE 2.86 ST (ELECTRODE) ×3 IMPLANT
ELECT REM PT RETURN 9FT ADLT (ELECTROSURGICAL) ×3
ELECTRODE REM PT RTRN 9FT ADLT (ELECTROSURGICAL) ×1 IMPLANT
GLOVE BIO SURGEON STRL SZ 6.5 (GLOVE) ×2 IMPLANT
GLOVE BIO SURGEON STRL SZ7.5 (GLOVE) ×6 IMPLANT
GLOVE BIO SURGEONS STRL SZ 6.5 (GLOVE) ×1
GLOVE BIOGEL PI IND STRL 6.5 (GLOVE) ×1 IMPLANT
GLOVE BIOGEL PI INDICATOR 6.5 (GLOVE) ×2
GOWN STRL REUS W/ TWL LRG LVL3 (GOWN DISPOSABLE) ×2 IMPLANT
GOWN STRL REUS W/TWL LRG LVL3 (GOWN DISPOSABLE) ×4
ILLUMINATOR WAVEGUIDE N/F (MISCELLANEOUS) IMPLANT
KIT MARKER MARGIN INK (KITS) ×3 IMPLANT
LIGHT WAVEGUIDE WIDE FLAT (MISCELLANEOUS) IMPLANT
NEEDLE HYPO 25X1 1.5 SAFETY (NEEDLE) ×3 IMPLANT
NS IRRIG 1000ML POUR BTL (IV SOLUTION) IMPLANT
PACK BASIN DAY SURGERY FS (CUSTOM PROCEDURE TRAY) ×3 IMPLANT
PENCIL SMOKE EVACUATOR (MISCELLANEOUS) ×3 IMPLANT
SLEEVE SCD COMPRESS KNEE MED (MISCELLANEOUS) ×3 IMPLANT
SPONGE LAP 18X18 RF (DISPOSABLE) ×3 IMPLANT
SUT MON AB 4-0 PC3 18 (SUTURE) ×3 IMPLANT
SUT SILK 2 0 SH (SUTURE) IMPLANT
SUT VICRYL 3-0 CR8 SH (SUTURE) ×3 IMPLANT
SYR CONTROL 10ML LL (SYRINGE) ×3 IMPLANT
TOWEL GREEN STERILE FF (TOWEL DISPOSABLE) ×3 IMPLANT
TRAY FAXITRON CT DISP (TRAY / TRAY PROCEDURE) ×3 IMPLANT
TUBE CONNECTING 20'X1/4 (TUBING) ×1
TUBE CONNECTING 20X1/4 (TUBING) ×2 IMPLANT
YANKAUER SUCT BULB TIP NO VENT (SUCTIONS) IMPLANT

## 2019-07-23 NOTE — Anesthesia Procedure Notes (Signed)
Procedure Name: LMA Insertion Date/Time: 07/23/2019 10:57 AM Performed by: Signe Colt, CRNA Pre-anesthesia Checklist: Patient identified, Emergency Drugs available, Suction available and Patient being monitored Patient Re-evaluated:Patient Re-evaluated prior to induction Oxygen Delivery Method: Circle system utilized Preoxygenation: Pre-oxygenation with 100% oxygen Induction Type: IV induction Ventilation: Mask ventilation without difficulty LMA: LMA inserted LMA Size: 4.0 Number of attempts: 1 Airway Equipment and Method: Bite block Placement Confirmation: positive ETCO2 Tube secured with: Tape Dental Injury: Teeth and Oropharynx as per pre-operative assessment

## 2019-07-23 NOTE — Op Note (Signed)
07/23/2019  11:39 AM  PATIENT:  Caitlin Castillo  44 y.o. female  PRE-OPERATIVE DIAGNOSIS:  RIGHT BREAST COMPLEX SCLEROSING LESION  POST-OPERATIVE DIAGNOSIS:  RIGHT BREAST COMPLEX SCLEROSING LESION  PROCEDURE:  Procedure(s): RIGHT BREAST LUMPECTOMY WITH RADIOACTIVE SEED LOCALIZATION (Right)  SURGEON:  Surgeon(s) and Role:    Jovita Kussmaul, MD - Primary  PHYSICIAN ASSISTANT:   ASSISTANTS: none   ANESTHESIA:   local and general  EBL:  Minimal   BLOOD ADMINISTERED:none  DRAINS: none   LOCAL MEDICATIONS USED:  MARCAINE     SPECIMEN:  Source of Specimen:  right breast tissue  DISPOSITION OF SPECIMEN:  PATHOLOGY  COUNTS:  YES  TOURNIQUET:  * No tourniquets in log *  DICTATION: .Dragon Dictation   After informed consent was obtained the patient was brought to the operating room and placed in the supine position on the operating table.  After adequate induction of general anesthesia the patient's right breast was prepped with ChloraPrep, allowed to dry, and draped in usual sterile manner.  An appropriate timeout was performed.  Previously an I-125 seed was placed in the inner aspect of the right breast to mark an area of complex sclerosing lesion.  The neoprobe was set to I-125 in the area of radioactivity was readily identified.  The area around this was infiltrated with quarter percent Marcaine.  A curvilinear incision was made along the upper inner edge of the areola of the right breast with a 15 blade knife.  The incision was carried through the skin and subcutaneous tissue sharply with the electrocautery.  Dissection was then carried out sharply with the electrocautery towards the radioactive seed under the direction of the neoprobe.  Once I more closely approached the radioactive seed I then removed a circular portion of breast tissue sharply with the electrocautery around the radioactive seed while checking the area of radioactivity frequently.  Once the specimen was removed  it was oriented with the appropriate paint colors.  A specimen radiograph was obtained that showed the clip and seed to be near the center of the specimen.  The specimen was then sent to pathology for further evaluation.  Hemostasis was achieved using the Bovie electrocautery.  The wound was irrigated with saline and infiltrated with more quarter percent Marcaine.  The deep layer of the wound was then closed with layers of interrupted 3-0 Vicryl stitches.  The skin was then closed with interrupted 4-0 Monocryl subcuticular stitches.  Dermabond dressings were applied.  The patient tolerated the procedure well.  At the end of the case all needle sponge and instrument counts were correct.  The patient was then awakened and taken to recovery in stable condition.  PLAN OF CARE: Discharge to home after PACU  PATIENT DISPOSITION:  PACU - hemodynamically stable.   Delay start of Pharmacological VTE agent (>24hrs) due to surgical blood loss or risk of bleeding: not applicable

## 2019-07-23 NOTE — Discharge Instructions (Signed)
No Tylenol before 3:45pm No ibuprofen before 5:45p    Post Anesthesia Home Care Instructions  Activity: Get plenty of rest for the remainder of the day. A responsible individual must stay with you for 24 hours following the procedure.  For the next 24 hours, DO NOT: -Drive a car -Paediatric nurse -Drink alcoholic beverages -Take any medication unless instructed by your physician -Make any legal decisions or sign important papers.  Meals: Start with liquid foods such as gelatin or soup. Progress to regular foods as tolerated. Avoid greasy, spicy, heavy foods. If nausea and/or vomiting occur, drink only clear liquids until the nausea and/or vomiting subsides. Call your physician if vomiting continues.  Special Instructions/Symptoms: Your throat may feel dry or sore from the anesthesia or the breathing tube placed in your throat during surgery. If this causes discomfort, gargle with warm salt water. The discomfort should disappear within 24 hours.  If you had a scopolamine patch placed behind your ear for the management of post- operative nausea and/or vomiting:  1. The medication in the patch is effective for 72 hours, after which it should be removed.  Wrap patch in a tissue and discard in the trash. Wash hands thoroughly with soap and water. 2. You may remove the patch earlier than 72 hours if you experience unpleasant side effects which may include dry mouth, dizziness or visual disturbances. 3. Avoid touching the patch. Wash your hands with soap and water after contact with the patch.

## 2019-07-23 NOTE — Transfer of Care (Signed)
Immediate Anesthesia Transfer of Care Note  Patient: Caitlin Castillo  Procedure(s) Performed: RIGHT BREAST LUMPECTOMY WITH RADIOACTIVE SEED LOCALIZATION (Right Breast)  Patient Location: PACU  Anesthesia Type:General  Level of Consciousness: drowsy and patient cooperative  Airway & Oxygen Therapy: Patient Spontanous Breathing and Patient connected to face mask oxygen  Post-op Assessment: Report given to RN and Post -op Vital signs reviewed and stable  Post vital signs: Reviewed and stable  Last Vitals:  Vitals Value Taken Time  BP 87/49 07/23/19 1143  Temp    Pulse 57 07/23/19 1144  Resp 13 07/23/19 1144  SpO2 99 % 07/23/19 1144  Vitals shown include unvalidated device data.  Last Pain:  Vitals:   07/23/19 0942  TempSrc: Tympanic  PainSc: 0-No pain         Complications: No apparent anesthesia complications

## 2019-07-23 NOTE — Interval H&P Note (Signed)
History and Physical Interval Note:  07/23/2019 10:42 AM  Caitlin Castillo  has presented today for surgery, with the diagnosis of RIGHT BREAST COMPLEX SCLEROSING LESION.  The various methods of treatment have been discussed with the patient and family. After consideration of risks, benefits and other options for treatment, the patient has consented to  Procedure(s): RIGHT BREAST LUMPECTOMY WITH RADIOACTIVE SEED LOCALIZATION (Right) as a surgical intervention.  The patient's history has been reviewed, patient examined, no change in status, stable for surgery.  I have reviewed the patient's chart and labs.  Questions were answered to the patient's satisfaction.     Autumn Messing III

## 2019-07-23 NOTE — Anesthesia Postprocedure Evaluation (Signed)
Anesthesia Post Note  Patient: Caitlin Castillo  Procedure(s) Performed: RIGHT BREAST LUMPECTOMY WITH RADIOACTIVE SEED LOCALIZATION (Right Breast)     Patient location during evaluation: PACU Anesthesia Type: General Level of consciousness: awake and alert, oriented and patient cooperative Pain management: pain level controlled Vital Signs Assessment: post-procedure vital signs reviewed and stable Respiratory status: spontaneous breathing, nonlabored ventilation and respiratory function stable Cardiovascular status: blood pressure returned to baseline and stable Postop Assessment: no apparent nausea or vomiting Anesthetic complications: no    Last Vitals:  Vitals:   07/23/19 1200 07/23/19 1209  BP: 105/69   Pulse: 74 77  Resp: (!) 24 13  Temp:    SpO2: 100% 100%    Last Pain:  Vitals:   07/23/19 1200  TempSrc:   PainSc: 0-No pain                 Pervis Hocking

## 2019-07-23 NOTE — H&P (Signed)
Caitlin Castillo  Location: [place] [facility] Patient #: [id number] DOB: 09/04/1975 Married / Language: English / Race: White Female   History of Present Illness  The patient is a 44 year old female who presents with a complaint of Breast problems. we are asked to see the patient in consultation by Dr. Webb Silversmith to evaluate her for a complex sclerosing lesion of the right breast. The patient is a 44 year old white female who recently went for her first screening mammogram. At that time she was found to have abnormal calcifications in both breasts. The left side was biopsied and was totally benign. The right side measured 2 cm and was biopsied and came back as a complex sclerosing lesion. She denies any significant breast pain or discharge from the nipple. She does not smoke. She has no family history of breast cancer.   Past Surgical History  Breast Biopsy  Bilateral. Tonsillectomy   Diagnostic Studies History  Colonoscopy  never Mammogram  within last year  Allergies  Metrogel *DERMATOLOGICALS*  Tape 1"X5yd *MEDICAL DEVICES AND SUPPLIES*  Shellfish   Medication History  No Current Medications Medications Reconciled  Social History Alcohol use  Occasional alcohol use. Caffeine use  Carbonated beverages, Coffee. No drug use  Tobacco use  Former smoker.  Family History  Arthritis  Mother.  Pregnancy / Birth History  Age at menarche  31 years. Gravida  5 Maternal age  17-20 Para  5 Regular periods   Other Problems  Gastroesophageal Reflux Disease     Review of Systems  General Present- Weight Gain. Not Present- Appetite Loss, Chills, Fatigue, Fever, Night Sweats and Weight Loss. Skin Not Present- Change in Wart/Mole, Dryness, Hives, Jaundice, New Lesions, Non-Healing Wounds, Rash and Ulcer. HEENT Not Present- Earache, Hearing Loss, Hoarseness, Nose Bleed, Oral Ulcers, Ringing in the Ears, Seasonal Allergies, Sinus Pain, Sore Throat, Visual  Disturbances, Wears glasses/contact lenses and Yellow Eyes. Respiratory Not Present- Bloody sputum, Chronic Cough, Difficulty Breathing, Snoring and Wheezing. Breast Not Present- Breast Mass, Breast Pain, Nipple Discharge and Skin Changes. Cardiovascular Not Present- Chest Pain, Difficulty Breathing Lying Down, Leg Cramps, Palpitations, Rapid Heart Rate, Shortness of Breath and Swelling of Extremities. Gastrointestinal Present- Constipation. Not Present- Abdominal Pain, Bloating, Bloody Stool, Change in Bowel Habits, Chronic diarrhea, Difficulty Swallowing, Excessive gas, Gets full quickly at meals, Hemorrhoids, Indigestion, Nausea, Rectal Pain and Vomiting. Female Genitourinary Not Present- Frequency, Nocturia, Painful Urination, Pelvic Pain and Urgency. Musculoskeletal Present- Back Pain. Not Present- Joint Pain, Joint Stiffness, Muscle Pain, Muscle Weakness and Swelling of Extremities. Neurological Not Present- Decreased Memory, Fainting, Headaches, Numbness, Seizures, Tingling, Tremor, Trouble walking and Weakness. Psychiatric Present- Anxiety. Not Present- Bipolar, Change in Sleep Pattern, Depression, Fearful and Frequent crying. Endocrine Not Present- Cold Intolerance, Excessive Hunger, Hair Changes, Heat Intolerance, Hot flashes and New Diabetes. Hematology Not Present- Blood Thinners, Easy Bruising, Excessive bleeding, Gland problems, HIV and Persistent Infections.  Vitals  Weight: 181.25 lb Height: 68in Body Surface Area: 1.96 m Body Mass Index: 27.56 kg/m  Temp.: 98.83F(Oral)  Pulse: 86 (Regular)        Physical Exam  General Mental Status-Alert. General Appearance-Consistent with stated age. Hydration-Well hydrated. Voice-Normal.  Head and Neck Head-normocephalic, atraumatic with no lesions or palpable masses. Trachea-midline. Thyroid Gland Characteristics - normal size and consistency.  Eye Eyeball - Bilateral-Extraocular movements  intact. Sclera/Conjunctiva - Bilateral-No scleral icterus.  Chest and Lung Exam Chest and lung exam reveals -quiet, even and easy respiratory effort with no use of accessory muscles and on  auscultation, normal breath sounds, no adventitious sounds and normal vocal resonance. Inspection Chest Wall - Normal. Back - normal.  Breast Note: there is no palpable mass in either breast. There is no palpable axillary, supraclavicular, or cervical lymphadenopathy.   Cardiovascular Cardiovascular examination reveals -normal heart sounds, regular rate and rhythm with no murmurs and normal pedal pulses bilaterally.  Abdomen Inspection Inspection of the abdomen reveals - No Hernias. Skin - Scar - no surgical scars. Palpation/Percussion Palpation and Percussion of the abdomen reveal - Soft, Non Tender, No Rebound tenderness, No Rigidity (guarding) and No hepatosplenomegaly. Auscultation Auscultation of the abdomen reveals - Bowel sounds normal.  Neurologic Neurologic evaluation reveals -alert and oriented x 3 with no impairment of recent or remote memory. Mental Status-Normal.  Musculoskeletal Normal Exam - Left-Upper Extremity Strength Normal and Lower Extremity Strength Normal. Normal Exam - Right-Upper Extremity Strength Normal and Lower Extremity Strength Normal.  Lymphatic Head & Neck  General Head & Neck Lymphatics: Bilateral - Description - Normal. Axillary  General Axillary Region: Bilateral - Description - Normal. Tenderness - Non Tender. Femoral & Inguinal  Generalized Femoral & Inguinal Lymphatics: Bilateral - Description - Normal. Tenderness - Non Tender.    Assessment & Plan  SCLEROSING ADENOSIS OF BREAST, RIGHT (N60.21) Impression: the patient appears to have a 2 cm area of complex sclerosing lesion with calcification in the upper inner quadrant of the right breast. Because of its abnormal appearance and because there is a 5-10% chance of missing something  more significant the recommendation would be to have this area removed. She would also like to have this done. I have discussed with her in detail the risks and benefits of the operation to do this as well as some of the technical aspects including the use of a wire for localization and she understands and wishes to proceed.

## 2019-07-23 NOTE — Anesthesia Preprocedure Evaluation (Addendum)
Anesthesia Evaluation  Patient identified by MRN, date of birth, ID band Patient awake    Reviewed: Allergy & Precautions, NPO status , Patient's Chart, lab work & pertinent test results  Airway Mallampati: II  TM Distance: >3 FB Neck ROM: Full    Dental no notable dental hx. (+) Teeth Intact, Dental Advisory Given   Pulmonary neg pulmonary ROS,    Pulmonary exam normal breath sounds clear to auscultation       Cardiovascular negative cardio ROS Normal cardiovascular exam Rhythm:Regular Rate:Normal     Neuro/Psych PSYCHIATRIC DISORDERS Anxiety negative neurological ROS     GI/Hepatic Neg liver ROS, GERD  Medicated and Controlled,Asymptomatic currently   Endo/Other  negative endocrine ROS  Renal/GU negative Renal ROS  negative genitourinary   Musculoskeletal negative musculoskeletal ROS (+)   Abdominal Normal abdominal exam  (+)   Peds negative pediatric ROS (+)  Hematology negative hematology ROS (+)   Anesthesia Other Findings Right breast sclerosing lesion  Reproductive/Obstetrics negative OB ROS                            Anesthesia Physical Anesthesia Plan  ASA: II  Anesthesia Plan: General   Post-op Pain Management:    Induction: Intravenous  PONV Risk Score and Plan: 3 and Ondansetron, Dexamethasone, Midazolam and Treatment may vary due to age or medical condition  Airway Management Planned: LMA  Additional Equipment: None  Intra-op Plan:   Post-operative Plan: Extubation in OR  Informed Consent: I have reviewed the patients History and Physical, chart, labs and discussed the procedure including the risks, benefits and alternatives for the proposed anesthesia with the patient or authorized representative who has indicated his/her understanding and acceptance.     Dental advisory given  Plan Discussed with: CRNA  Anesthesia Plan Comments:         Anesthesia  Quick Evaluation

## 2019-07-26 ENCOUNTER — Encounter: Payer: Self-pay | Admitting: *Deleted

## 2019-07-26 LAB — SURGICAL PATHOLOGY

## 2019-07-27 IMAGING — CT CT ABD-PELV W/ CM
2 of 4 series · 15 of 46 positions shown, 17 images · IV contrast (ISOVUE 300)
Comparison: None.

CLINICAL DATA: Right upper quadrant pain

EXAM:
CT ABDOMEN AND PELVIS WITH CONTRAST
TECHNIQUE: Multidetector CT imaging of the abdomen and pelvis was performed
using the standard protocol following bolus administration of
intravenous contrast.
CONTRAST:  100mL VRHV52-XII IOPAMIDOL (VRHV52-XII) INJECTION 61%

[Series 2: abd/pel w · axial · 0.69mm/px · z∈[-506,-101]mm · 12 of 93 slices shown, 14 images]
[im 8/93  soft-tissue]
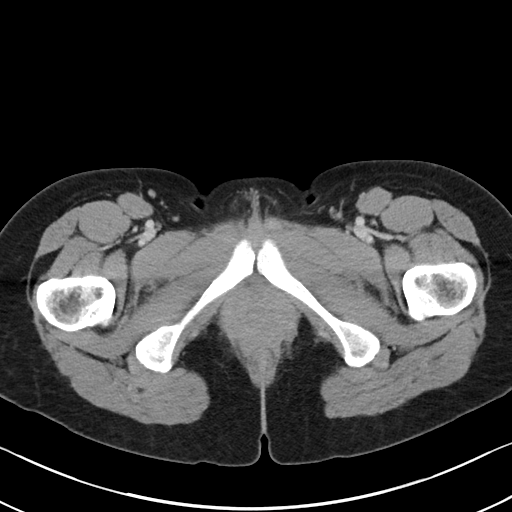
[im 8/93  bone]
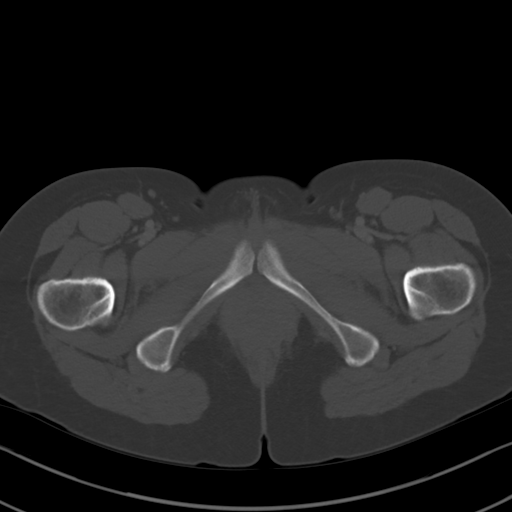
[im 15/93  soft-tissue]
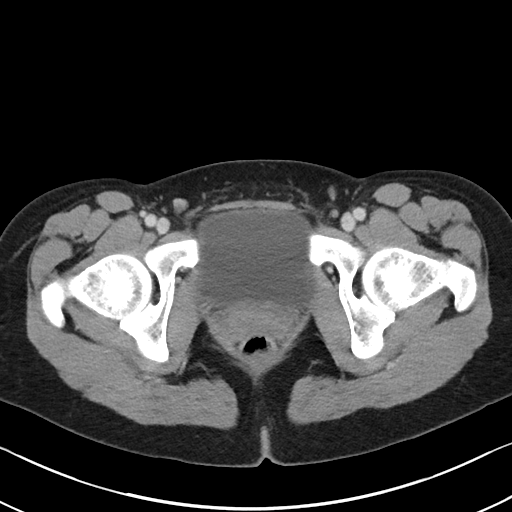
[im 23/93  soft-tissue]
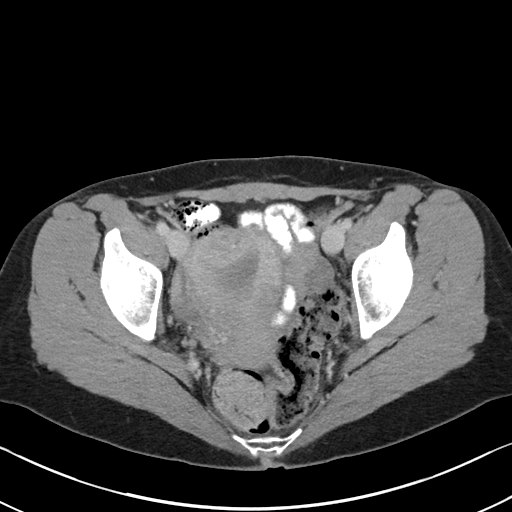
[im 30/93  soft-tissue]
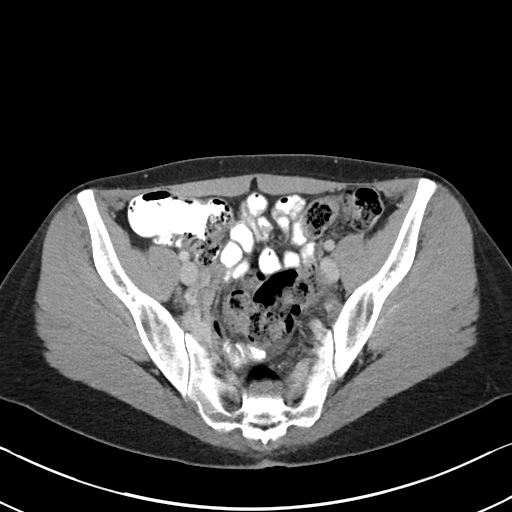
[im 37/93  soft-tissue]
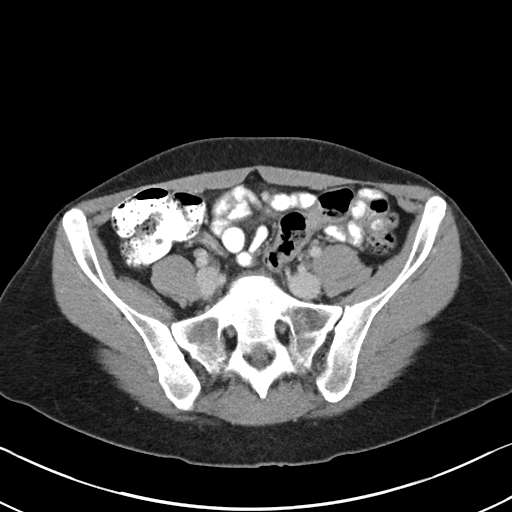
[im 45/93  soft-tissue]
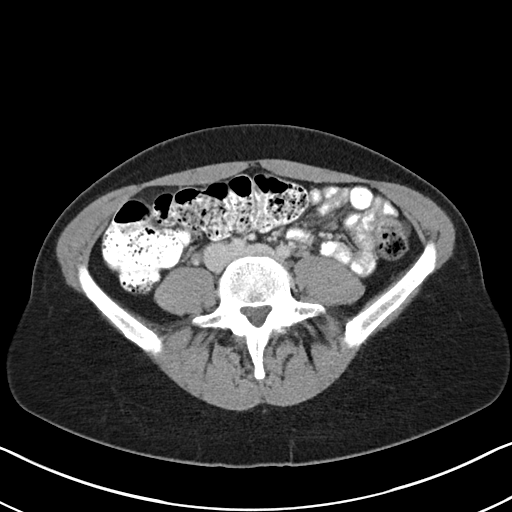
[im 52/93  soft-tissue]
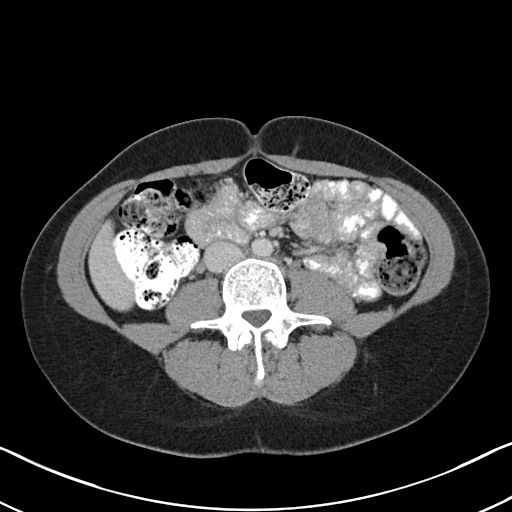
[im 59/93  soft-tissue]
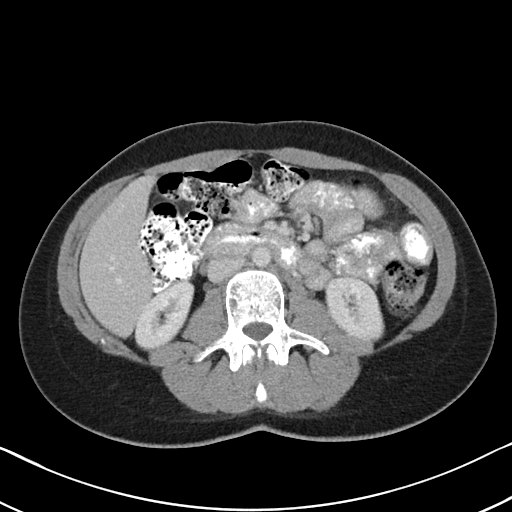
[im 67/93  soft-tissue]
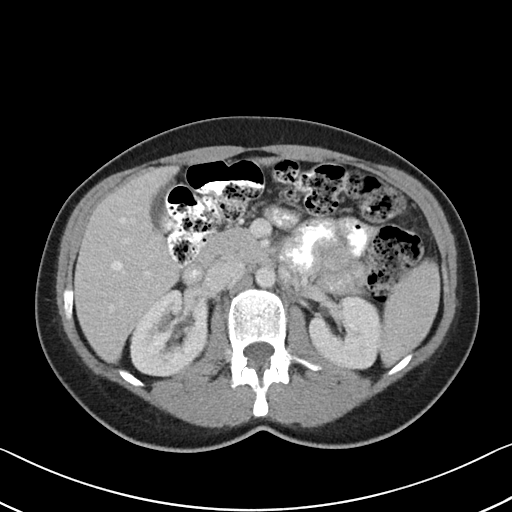
[im 67/93  bone]
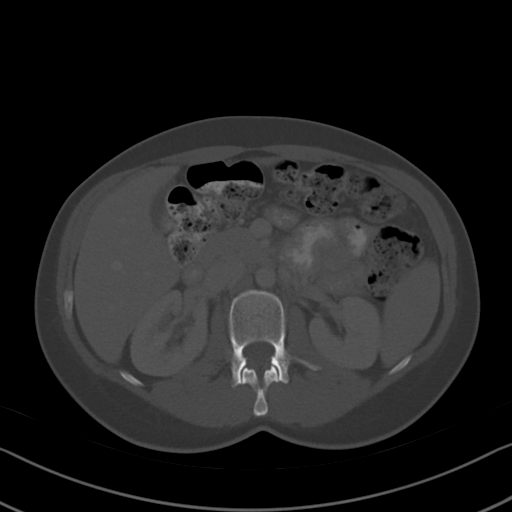
[im 74/93  soft-tissue]
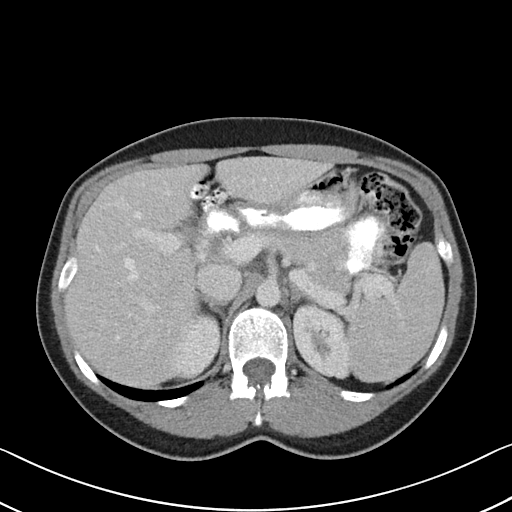
[im 81/93  soft-tissue]
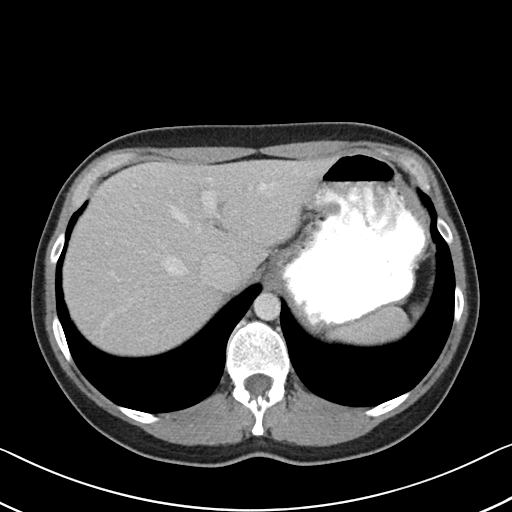
[im 89/93  soft-tissue]
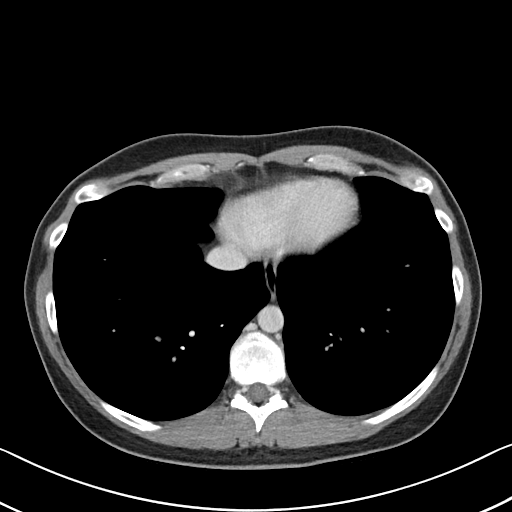

[Series 5: abd/pel w st · coronal · 0.62mm/px · 3 of 71 slices shown]
[im 24/71  soft-tissue]
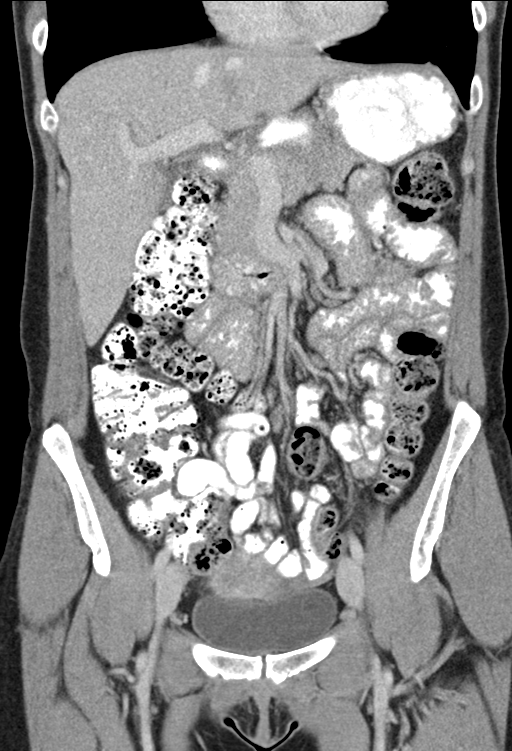
[im 32/71  soft-tissue]
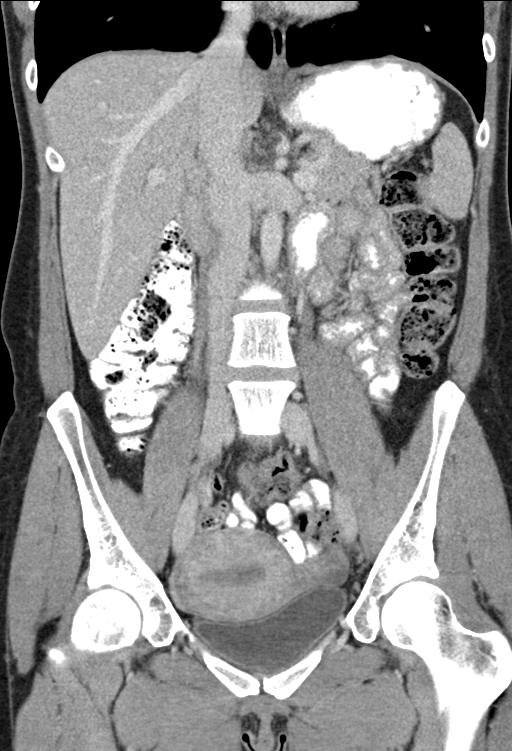
[im 39/71  soft-tissue]
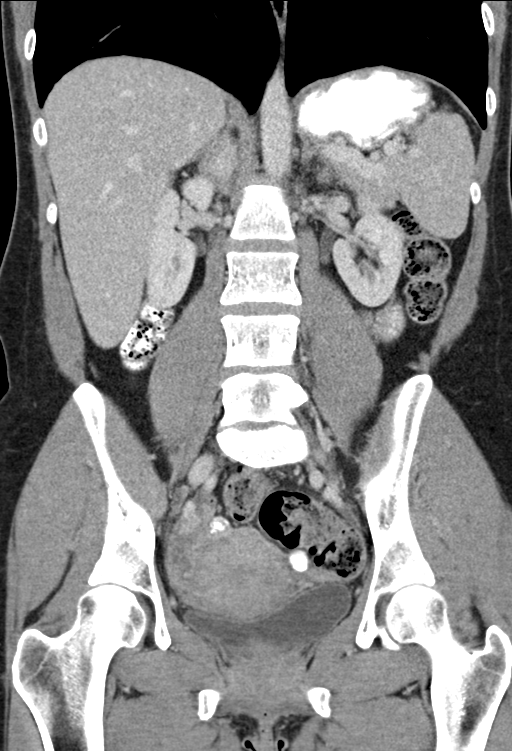

[15 of 46 positions shown; findings below may reference images not displayed]

FINDINGS: Lower chest: No acute abnormality.

Hepatobiliary: No focal liver abnormality is seen. No gallstones,
gallbladder wall thickening, or biliary dilatation.

Pancreas: Unremarkable. No pancreatic ductal dilatation or
surrounding inflammatory changes.

Spleen: Normal in size without focal abnormality.

Adrenals/Urinary Tract: Adrenal glands are within normal limits.
Kidneys are well visualized bilaterally. No renal calculi or
obstructive changes are seen. Bladder is well distended. A 1 cm cyst
is noted within the right kidney in the upper pole.

Stomach/Bowel: Stomach is within normal limits. Appendix appears
normal. No evidence of bowel wall thickening, distention, or
inflammatory changes.

Vascular/Lymphatic: No significant vascular findings are present. No
enlarged abdominal or pelvic lymph nodes.

Reproductive: Some heterogeneity to the uterus is noted likely
related to the timing of the contrast bolus. Small cystic changes
are noted within the ovaries bilaterally felt to be within normal
limits.

Other: No abdominal wall hernia or abnormality. No abdominopelvic
ascites.

Musculoskeletal: No acute or significant osseous findings.
IMPRESSION: No definitive acute abnormality is identified correspond with
patient's given clinical symptomatology.

Mild heterogeneity of the uterus which may be related to the timing
of the contrast bolus on mild contour irregularity is noted along
the superior aspect of the uterus which may represent a small
fibroid. The need for further evaluation can be determined on a
clinical basis.

## 2019-12-02 ENCOUNTER — Ambulatory Visit (INDEPENDENT_AMBULATORY_CARE_PROVIDER_SITE_OTHER): Payer: 59 | Admitting: Obstetrics and Gynecology

## 2019-12-02 ENCOUNTER — Encounter: Payer: Self-pay | Admitting: Obstetrics and Gynecology

## 2019-12-02 ENCOUNTER — Other Ambulatory Visit (HOSPITAL_COMMUNITY)
Admission: RE | Admit: 2019-12-02 | Discharge: 2019-12-02 | Disposition: A | Discharge status: Home / Self Care | Payer: 59 | Source: Ambulatory Visit | Attending: Obstetrics and Gynecology | Admitting: Obstetrics and Gynecology

## 2019-12-02 VITALS — BP 112/73 | HR 70 | Ht 68.0 in | Wt 173.1 lb

## 2019-12-02 DIAGNOSIS — Z124 Encounter for screening for malignant neoplasm of cervix: Secondary | ICD-10-CM | POA: Diagnosis present

## 2019-12-02 DIAGNOSIS — R300 Dysuria: Secondary | ICD-10-CM

## 2019-12-02 DIAGNOSIS — Z01419 Encounter for gynecological examination (general) (routine) without abnormal findings: Secondary | ICD-10-CM

## 2019-12-02 DIAGNOSIS — Z1231 Encounter for screening mammogram for malignant neoplasm of breast: Secondary | ICD-10-CM

## 2019-12-02 LAB — POCT URINALYSIS DIPSTICK
Bilirubin, UA: NEGATIVE
Blood, UA: NEGATIVE
Glucose, UA: NEGATIVE
Ketones, UA: NEGATIVE
Leukocytes, UA: NEGATIVE
Nitrite, UA: NEGATIVE
Protein, UA: NEGATIVE
Spec Grav, UA: 1.01 (ref 1.010–1.025)
Urobilinogen, UA: 0.2 E.U./dL
pH, UA: 6 (ref 5.0–8.0)

## 2019-12-02 NOTE — Progress Notes (Signed)
HPI:      [name] is a 44 y.o. [id number] who LMP was Patient's last menstrual period was 11/14/2019.  Subjective:   She presents today for her annual examination.  She is having normal regular menstrual periods.  Her husband has a vasectomy for birth control.  She occasionally has midcycle pain consistent with ovulation.  She says that she is mostly doing well with her menses even though that they are occasionally heavy. She underwent a right breast excisional biopsy within the last year.  Benign findings.  Mammogram due in January. Patient is currently unvaccinated with Covid but is planning vaccination in the near future.    Hx: The following portions of the patient's history were reviewed and updated as appropriate:             She  has a past medical history of Allergy, Anxiety, Chronic constipation, Endometriosis, Fibrocystic breast, Ovarian cyst, Pelvic pain in female, and Right lower quadrant pain. She does not have any pertinent problems on file. She  has a past surgical history that includes Tonsillectomy and adenoidectomy; Wisdom tooth extraction; Breast biopsy (Left, 05/13/2019); Breast biopsy (Right, 05/13/2019); and Breast lumpectomy with radioactive seed localization (Right, 07/23/2019). Her family history includes Brain cancer in her maternal grandfather; Drug abuse in her maternal aunt and maternal uncle; Endometriosis in her mother; Prostate cancer in her maternal grandfather; Stroke in her maternal grandfather. She  reports that she has never smoked. She has never used smokeless tobacco. She reports current alcohol use. She reports that she does not use drugs. She currently has no medications in their medication list. She is allergic to citrus, shellfish allergy, tape, metrogel [metronidazole], and tylenol with codeine #3 [acetaminophen-codeine].       Review of Systems:  Review of Systems  Constitutional: Denied constitutional symptoms, night sweats, recent illness,  fatigue, fever, insomnia and weight loss.  Eyes: Denied eye symptoms, eye pain, photophobia, vision change and visual disturbance.  Ears/Nose/Throat/Neck: Denied ear, nose, throat or neck symptoms, hearing loss, nasal discharge, sinus congestion and sore throat.  Cardiovascular: Denied cardiovascular symptoms, arrhythmia, chest pain/pressure, edema, exercise intolerance, orthopnea and palpitations.  Respiratory: Denied pulmonary symptoms, asthma, pleuritic pain, productive sputum, cough, dyspnea and wheezing.  Gastrointestinal: Denied, gastro-esophageal reflux, melena, nausea and vomiting.  Genitourinary: Denied genitourinary symptoms including symptomatic vaginal discharge, pelvic relaxation issues, and urinary complaints.  Musculoskeletal: Denied musculoskeletal symptoms, stiffness, swelling, muscle weakness and myalgia.  Dermatologic: Denied dermatology symptoms, rash and scar.  Neurologic: Denied neurology symptoms, dizziness, headache, neck pain and syncope.  Psychiatric: Denied psychiatric symptoms, anxiety and depression.  Endocrine: Denied endocrine symptoms including hot flashes and night sweats.   Meds:   No current outpatient medications on file prior to visit.   No current facility-administered medications on file prior to visit.    Objective:     Vitals:   12/02/19 0803  BP: 112/73  Pulse: 70              Physical examination General NAD, Conversant  HEENT Atraumatic; Op clear with mmm.  Normo-cephalic. Pupils reactive. Anicteric sclerae  Thyroid/Neck Smooth without nodularity or enlargement. Normal ROM.  Neck Supple.  Skin No rashes, lesions or ulceration. Normal palpated skin turgor. No nodularity.  Breasts: No masses or discharge.  Symmetric.  No axillary adenopathy.  Lungs: Clear to auscultation.No rales or wheezes. Normal Respiratory effort, no retractions.  Heart: NSR.  No murmurs or rubs appreciated. No periferal edema  Abdomen: Soft.  Non-tender.  No  masses.   No HSM. No hernia  Extremities: Moves all appropriately.  Normal ROM for age. No lymphadenopathy.  Neuro: Oriented to PPT.  Normal mood. Normal affect.     Pelvic:   Vulva: Normal appearance.  No lesions.  Vagina: No lesions or abnormalities noted.  Support:  Second-degree uterine descensus  Urethra No masses tenderness or scarring.  Meatus Normal size without lesions or prolapse.  Cervix: Normal appearance.  No lesions.  Anus: Normal exam.  No lesions.  Perineum: Normal exam.  No lesions.        Bimanual   Uterus: Normal size.  Non-tender.  Mobile.  AV firm, slightly irregular  Adnexae: No masses.  Non-tender to palpation.  Cul-de-sac: Negative for abnormality.      Assessment:    [id number] Patient Active Problem List   Diagnosis Date Noted  . Seasonal allergies 03/20/2015  . GERD (gastroesophageal reflux disease) 03/20/2015  . Atrial tachycardia ([place]) 11/25/2011     1. Well woman exam with routine gynecological exam   2. Screening for cervical cancer   3. Dysuria   4. Encounter for screening mammogram for malignant neoplasm of breast     Normal exam   Plan:            1.  Basic Screening Recommendations The basic screening recommendations for asymptomatic women were discussed with the patient during her visit.  The age-appropriate recommendations were discussed with her and the rational for the tests reviewed.  When I am informed by the patient that another primary care physician has previously obtained the age-appropriate tests and they are up-to-date, only outstanding tests are ordered and referrals given as necessary.  Abnormal results of tests will be discussed with her when all of her results are completed.  Routine preventative health maintenance measures emphasized: Exercise/Diet/Weight control, Tobacco Warnings, Alcohol/Substance use risks and Stress Management Pap performed mammogram ordered patient to return for fasting blood work Orders Orders Placed This  Encounter  Procedures  . MM 3D SCREEN BREAST BILATERAL  . POCT urinalysis dipstick    No orders of the defined types were placed in this encounter.           F/U  Return in about 1 year (around 12/01/2020) for Annual Physical.  Finis Bud, M.D. 12/02/2019 8:37 AM

## 2019-12-02 NOTE — Addendum Note (Signed)
Addended by: Durwin Glaze on: 12/02/2019 10:34 AM   Modules accepted: Orders

## 2019-12-03 LAB — CYTOLOGY - PAP
Comment: NEGATIVE
Diagnosis: NEGATIVE
High risk HPV: NEGATIVE

## 2019-12-07 ENCOUNTER — Other Ambulatory Visit: Payer: 59

## 2020-08-18 ENCOUNTER — Encounter (HOSPITAL_COMMUNITY): Payer: Self-pay

## 2020-10-23 ENCOUNTER — Encounter: Payer: Self-pay | Admitting: Internal Medicine

## 2020-10-24 ENCOUNTER — Ambulatory Visit: Payer: Self-pay | Admitting: Internal Medicine

## 2020-10-24 NOTE — Telephone Encounter (Signed)
I spoke with the patient and she informed me that she can arrive today at 3:40pm for a 4:00pm appointment.

## 2020-10-24 NOTE — Progress Notes (Deleted)
Subjective:    Patient ID: Caitlin Castillo, female    DOB: [birth year], 45 y.o.   MRN: [id number]  HPI  Pt presents to the clinic today with c/o right calf pain. She reports this started. She describes the pain as. She denies any injury to the area. She has had rhabdomyolysis in the past due to working out and not hydrating.  Review of Systems     Past Medical History:  Diagnosis Date   Allergy    Anxiety    mild   Chronic constipation    Endometriosis    Fibrocystic breast    Ovarian cyst    Pelvic pain in female    Right lower quadrant pain     No current outpatient medications on file.   No current facility-administered medications for this visit.    Allergies  Allergen Reactions   Citrus    Shellfish Allergy    Tape    Metrogel [Metronidazole] Rash    ??    Tylenol With Codeine #3 [Acetaminophen-Codeine] Rash    Family History  Problem Relation Age of Onset   Prostate cancer Maternal Grandfather    Brain cancer Maternal Grandfather    Stroke Maternal Grandfather    Endometriosis Mother    Drug abuse Maternal Aunt    Drug abuse Maternal Uncle    Colon cancer Neg Hx    Ovarian cancer Neg Hx    Cancer Neg Hx    Diabetes Neg Hx    Heart disease Neg Hx    Breast cancer Neg Hx     Social History   Socioeconomic History   Marital status: Married    Spouse name: Not on file   Number of children: Not on file   Years of education: Not on file   Highest education level: Not on file  Occupational History   Not on file  Tobacco Use   Smoking status: Never   Smokeless tobacco: Never  Vaping Use   Vaping Use: Never used  Substance and Sexual Activity   Alcohol use: Yes    Alcohol/week: 0.0 standard drinks    Comment: occasional   Drug use: No   Sexual activity: Yes    Birth control/protection: Surgical    Comment: vasctomy  Other Topics Concern   Not on file  Social History Narrative   ** Merged History Encounter **       Social Determinants  of Health   Financial Resource Strain: Not on file  Food Insecurity: Not on file  Transportation Needs: Not on file  Physical Activity: Not on file  Stress: Not on file  Social Connections: Not on file  Intimate Partner Violence: Not on file     Constitutional: Denies fever, malaise, fatigue, headache or abrupt weight changes.  HEENT: Denies eye pain, eye redness, ear pain, ringing in the ears, wax buildup, runny nose, nasal congestion, bloody nose, or sore throat. Respiratory: Denies difficulty breathing, shortness of breath, cough or sputum production.   Cardiovascular: Denies chest pain, chest tightness, palpitations or swelling in the hands or feet.  Gastrointestinal: Denies abdominal pain, bloating, constipation, diarrhea or blood in the stool.  GU: Denies urgency, frequency, pain with urination, burning sensation, blood in urine, odor or discharge. Musculoskeletal: Pt reports right calf pain. Denies decrease in range of motion, difficulty with gait, or joint pain and swelling.  Skin: Denies redness, rashes, lesions or ulcercations.  Neurological: Denies dizziness, difficulty with memory, difficulty with speech or problems with balance  and coordination.  Psych: Denies anxiety, depression, SI/HI.  No other specific complaints in a complete review of systems (except as listed in HPI above).  Objective:   Physical Exam   There were no vitals taken for this visit. Wt Readings from Last 3 Encounters:  12/02/19 173 lb 1.6 oz (78.5 kg)  07/23/19 176 lb 2.4 oz (79.9 kg)  04/13/19 176 lb 9.6 oz (80.1 kg)    General: Appears their stated age, well developed, well nourished in NAD. Skin: Warm, dry and intact. No rashes, lesions or ulcerations noted. HEENT: Head: normal shape and size; Eyes: sclera white, no icterus, conjunctiva pink, PERRLA and EOMs intact; Ears: Tm's gray and intact, normal light reflex; Nose: mucosa pink and moist, septum midline; Throat/Mouth: Teeth present, mucosa  pink and moist, no exudate, lesions or ulcerations noted.  Neck:  Neck supple, trachea midline. No masses, lumps or thyromegaly present.  Cardiovascular: Normal rate and rhythm. S1,S2 noted.  No murmur, rubs or gallops noted. No JVD or BLE edema. No carotid bruits noted. Pulmonary/Chest: Normal effort and positive vesicular breath sounds. No respiratory distress. No wheezes, rales or ronchi noted.  Abdomen: Soft and nontender. Normal bowel sounds. No distention or masses noted. Liver, spleen and kidneys non palpable. Musculoskeletal: Normal range of motion. No signs of joint swelling. No difficulty with gait.  Neurological: Alert and oriented. Cranial nerves II-XII grossly intact. Coordination normal.  Psychiatric: Mood and affect normal. Behavior is normal. Judgment and thought content normal.   EKG:  BMET    Component Value Date/Time   NA 138 01/01/2018 1019   K 4.1 01/01/2018 1019   CL 103 01/01/2018 1019   CO2 30 01/01/2018 1019   GLUCOSE 97 01/01/2018 1019   BUN 14 01/01/2018 1019   CREATININE 0.84 01/01/2018 1019   CALCIUM 9.3 01/01/2018 1019    Lipid Panel     Component Value Date/Time   CHOL 140 12/01/2018 0919   TRIG 44 12/01/2018 0919   HDL 62 12/01/2018 0919   CHOLHDL 2.3 12/01/2018 0919   LDLCALC 69 12/01/2018 0919    CBC    Component Value Date/Time   WBC 11.9 (H) 02/14/2012 0359   RBC 4.29 02/14/2012 0359   HGB 11.7 (L) 02/15/2012 0604   HCT 35.1 02/14/2012 0359   PLT 190 02/14/2012 0359   MCV 82 02/14/2012 0359   MCH 27.2 02/14/2012 0359   MCHC 33.2 02/14/2012 0359   RDW 13.9 02/14/2012 0359   LYMPHSABS 2.3 02/14/2012 0359   MONOABS 0.6 02/14/2012 0359   EOSABS 0.0 02/14/2012 0359   BASOSABS 0.0 02/14/2012 0359    Hgb A1C Lab Results  Component Value Date   HGBA1C 5.1 12/01/2018           Assessment & Plan:    Webb Silversmith, NP This visit occurred during the SARS-CoV-2 public health emergency.  Safety protocols were in place, including  screening questions prior to the visit, additional usage of staff PPE, and extensive cleaning of exam room while observing appropriate contact time as indicated for disinfecting solutions.

## 2020-11-22 ENCOUNTER — Ambulatory Visit: Payer: 59 | Admitting: Dermatology

## 2020-11-22 ENCOUNTER — Other Ambulatory Visit: Payer: Self-pay

## 2020-11-22 DIAGNOSIS — B360 Pityriasis versicolor: Secondary | ICD-10-CM

## 2020-11-22 DIAGNOSIS — D239 Other benign neoplasm of skin, unspecified: Secondary | ICD-10-CM

## 2020-11-22 DIAGNOSIS — D2362 Other benign neoplasm of skin of left upper limb, including shoulder: Secondary | ICD-10-CM | POA: Diagnosis not present

## 2020-11-22 MED ORDER — KETOCONAZOLE 2 % EX FOAM: CUTANEOUS | 2 refills | Status: DC

## 2020-11-22 NOTE — Progress Notes (Signed)
   Follow-Up Visit   Subjective  Caitlin Castillo is a 45 y.o. female who presents for the following: Growth (Left upper arm. Present for years, but has gotten hard and itchy at times. ). No changes in size or shape.  Also has recurrent white spots on back.   The following portions of the chart were reviewed this encounter and updated as appropriate:       Review of Systems:  No other skin or systemic complaints except as noted in HPI or Assessment and Plan.  Objective  Well appearing patient in no apparent distress; mood and affect are within normal limits.  A focused examination was performed including left upper arm. Relevant physical exam findings are noted in the Assessment and Plan.  Left Upper Arm 2.47m med dark brown/blue papule, regular border and pigment     Upper back Hypopigmented scaly patches.   Assessment & Plan  Blue nevus Left Upper Arm  Benign-appearing.  Observation.  Call clinic for new or changing moles.  Recommend daily use of broad spectrum spf 30+ sunscreen to sun-exposed areas.    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  It is generally asymptomatic. The rash is due to overgrowth of a common type of yeast present on everyone's skin and it is not contagious.  It tends to flare more in the summer due to increased sweating on trunk.  After rash is treated, the scaliness will resolve, but the discoloration will take longer to return to normal pigmentation. The periodic use of an OTC medicated soap/shampoo with zinc or selenium sulfide can be helpful to prevent yeast overgrowth and recurrence.   Start ketoconazole 2% foam Apply to back QD dsp 100g 2Rf.  Recommend Vanicream Zbar in shower during summer as preventative   Ketoconazole 2 % FOAM - Upper back Apply to back once a day for tinea versicolor.  Return in about 6 months  (around 05/25/2021) for TBSE.  I,Jamesetta Orleans CMA, am acting as scribe for TBrendolyn Patty MD .  Documentation: I have reviewed the above documentation for accuracy and completeness, and I agree with the above.  TBrendolyn PattyMD

## 2020-11-22 NOTE — Patient Instructions (Addendum)
Tinea Versicolor  Tinea versicolor is a skin infection. It is caused by a type of yeast. It is normal for some yeast to be on your skin, but too much yeast causes thisinfection. The infection causes a rash of light or dark patches on your skin. The rash is most common on the chest, back, neck, or upper arms. The infection usually does not cause other problems. If it is treated, it will probably go away in a few weeks. The infection cannot be spread from one person to another (is not contagious). Follow these instructions at home: Use over-the-counter and prescription medicines only as told by your doctor. Scrub your skin every day with dandruff shampoo as told by your doctor. Do not scratch your skin in the rash area. Avoid places that are hot and humid. Do not use tanning booths. Try to avoid sweating a lot. Contact a doctor if: Your symptoms get worse. You have a fever. You have redness, swelling, or pain in the rash area. You have fluid or blood coming from your rash. Your rash feels warm to the touch. You have pus or a bad smell coming from your rash. Your rash comes back (recurs) after treatment. Summary Tinea versicolor is a skin infection. It causes a rash of light or dark patches on your skin. The rash is most common on the chest, back, neck, or upper arms. This infection usually does not cause other problems. Use over-the-counter and prescription medicines only as told by your doctor. If the infection is treated, it will probably go away in a few weeks. This information is not intended to replace advice given to you by your health care provider. Make sure you discuss any questions you have with your healthcare provider. Document Revised: 02/08/2020 Document Reviewed: 02/09/2020 Elsevier Patient Education  2022 [employer].  If you have any questions or concerns for your doctor, please call our main line at [phone] and press option 4 to reach your doctor's medical  assistant. If no one answers, please leave a voicemail as directed and we will return your call as soon as possible. Messages left after 4 pm will be answered the following business day.   You may also send [place] a message via [place]. We typically respond to MyChart messages within 1-2 business days.  For prescription refills, please ask your pharmacy to contact our office. Our fax number is [phone].  If you have an urgent issue when the clinic is closed that cannot wait until the next business day, you can page your doctor at the number below.    Please note that while we do our best to be available for urgent issues outside of office hours, we are not available 24/7.   If you have an urgent issue and are unable to reach [place], you may choose to seek medical care at your doctor's office, retail clinic, urgent care center, or emergency room.  If you have a medical emergency, please immediately call 911 or go to the emergency department.  Pager Numbers  - Dr. Nehemiah Massed: [phone]  - Dr. Laurence Ferrari: [phone]  - Dr. Nicole Kindred: [phone]  In the event of inclement weather, please call our main line at [phone] for an update on the status of any delays or closures.  Dermatology Medication Tips: Please keep the boxes that topical medications come in in order to help keep track of the instructions about where and how to use these. Pharmacies typically print the medication instructions only on the boxes and not  directly on the medication tubes.   If your medication is too expensive, please contact our office at [phone] option 4 or send [place] a message through [place].   We are unable to tell what your co-pay for medications will be in advance as this is different depending on your insurance coverage. However, we may be able to find a substitute medication at lower cost or fill out paperwork to get insurance to cover a needed medication.   If a prior authorization is required to get your  medication covered by your insurance company, please allow [place] 1-2 business days to complete this process.  Drug prices often vary depending on where the prescription is filled and some pharmacies may offer cheaper prices.  The website www.goodrx.com contains coupons for medications through different pharmacies. The prices here do not account for what the cost may be with help from insurance (it may be cheaper with your insurance), but the website can give you the price if you did not use any insurance.  - You can print the associated coupon and take it with your prescription to the pharmacy.  - You may also stop by our office during regular business hours and pick up a GoodRx coupon card.  - If you need your prescription sent electronically to a different pharmacy, notify our office through [facility] or by phone at [phone] option 4.

## 2021-05-10 ENCOUNTER — Other Ambulatory Visit: Payer: Self-pay | Admitting: Obstetrics and Gynecology

## 2021-05-10 DIAGNOSIS — Z1231 Encounter for screening mammogram for malignant neoplasm of breast: Secondary | ICD-10-CM

## 2021-05-11 ENCOUNTER — Encounter: Payer: 59 | Admitting: Obstetrics and Gynecology

## 2021-06-05 ENCOUNTER — Ambulatory Visit: Payer: 59 | Admitting: Dermatology

## 2021-06-26 ENCOUNTER — Other Ambulatory Visit: Payer: Self-pay

## 2021-06-26 ENCOUNTER — Ambulatory Visit
Admission: RE | Admit: 2021-06-26 | Discharge: 2021-06-26 | Disposition: A | Discharge status: Home / Self Care | Payer: 59 | Source: Ambulatory Visit | Attending: Obstetrics and Gynecology | Admitting: Obstetrics and Gynecology

## 2021-06-26 DIAGNOSIS — Z1231 Encounter for screening mammogram for malignant neoplasm of breast: Secondary | ICD-10-CM | POA: Insufficient documentation

## 2021-07-12 ENCOUNTER — Encounter: Payer: 59 | Admitting: Obstetrics and Gynecology

## 2021-07-12 DIAGNOSIS — Z01419 Encounter for gynecological examination (general) (routine) without abnormal findings: Secondary | ICD-10-CM

## 2021-07-17 ENCOUNTER — Encounter: Payer: Self-pay | Admitting: Obstetrics and Gynecology

## 2021-07-17 ENCOUNTER — Ambulatory Visit (INDEPENDENT_AMBULATORY_CARE_PROVIDER_SITE_OTHER): Payer: 59 | Admitting: Obstetrics and Gynecology

## 2021-07-17 ENCOUNTER — Other Ambulatory Visit: Payer: Self-pay

## 2021-07-17 VITALS — BP 133/74 | HR 78 | Ht 68.0 in | Wt 170.0 lb

## 2021-07-17 DIAGNOSIS — Z01419 Encounter for gynecological examination (general) (routine) without abnormal findings: Secondary | ICD-10-CM | POA: Diagnosis not present

## 2021-07-17 DIAGNOSIS — R102 Pelvic and perineal pain: Secondary | ICD-10-CM | POA: Diagnosis not present

## 2021-07-17 NOTE — Progress Notes (Signed)
HPI: ?     [name] is a 46 y.o. [id number] who LMP was Patient's last menstrual period was 07/12/2021 (exact date). ? ?Subjective:  ? ?She presents today for her annual examination.  She states that after her recent mammography she has significant breast pain on the right side and her breast became swollen.  She says this has mostly resolved at this time and she is feeling better. ?She also complains of increasing pain with her menstrual periods and heavy menstrual bleeding.  She reports that she does have a history of a uterine fibroid.  She states that her pain is mostly midline and right-sided. ?Her husband has a vasectomy for birth control. ? ?  Hx: ?The following portions of the patient's history were reviewed and updated as appropriate: ?            She  has a past medical history of Allergy, Anxiety, Chronic constipation, Endometriosis, Fibrocystic breast, Ovarian cyst, Pelvic pain in female, and Right lower quadrant pain. ?She does not have any pertinent problems on file. ?She  has a past surgical history that includes Tonsillectomy and adenoidectomy; Wisdom tooth extraction; Breast biopsy (Left, 05/13/2019); Breast biopsy (Right, 05/13/2019); Breast lumpectomy with radioactive seed localization (Right, 07/23/2019); and Breast surgery (Right, 07/23/2019). ?Her family history includes Brain cancer in her maternal grandfather; Drug abuse in her maternal aunt and maternal uncle; Endometriosis in her mother; Prostate cancer in her maternal grandfather; Stroke in her maternal grandfather. ?She  reports that she has never smoked. She has never used smokeless tobacco. She reports current alcohol use. She reports that she does not use drugs. ?She currently has no medications in their medication list. ?She is allergic to citrus, shellfish allergy, tape, metrogel [metronidazole], and tylenol with codeine #3 [acetaminophen-codeine]. ?      ?Review of Systems:  ?Review of Systems ? ?Constitutional: Denied  constitutional symptoms, night sweats, recent illness, fatigue, fever, insomnia and weight loss.  ?Eyes: Denied eye symptoms, eye pain, photophobia, vision change and visual disturbance.  ?Ears/Nose/Throat/Neck: Denied ear, nose, throat or neck symptoms, hearing loss, nasal discharge, sinus congestion and sore throat.  ?Cardiovascular: Denied cardiovascular symptoms, arrhythmia, chest pain/pressure, edema, exercise intolerance, orthopnea and palpitations.  ?Respiratory: Denied pulmonary symptoms, asthma, pleuritic pain, productive sputum, cough, dyspnea and wheezing.  ?Gastrointestinal: Denied, gastro-esophageal reflux, melena, nausea and vomiting.  ?Genitourinary: See HPI for additional information.  ?Musculoskeletal: Denied musculoskeletal symptoms, stiffness, swelling, muscle weakness and myalgia.  ?Dermatologic: Denied dermatology symptoms, rash and scar.  ?Neurologic: Denied neurology symptoms, dizziness, headache, neck pain and syncope.  ?Psychiatric: Denied psychiatric symptoms, anxiety and depression.  ?Endocrine: Denied endocrine symptoms including hot flashes and night sweats.  ? ?Meds: ?  ?No current outpatient medications on file prior to visit.  ? ?No current facility-administered medications on file prior to visit.  ? ? ? ?Objective:  ?  ? ?There were no vitals filed for this visit.  ?Filed Weights  ? 07/17/21 1143  ?Weight: 170 lb (77.1 kg)  ? ?  ?         Physical examination ?General NAD, Conversant  ?HEENT Atraumatic; Op clear with mmm.  Normo-cephalic. Pupils reactive. Anicteric sclerae  ?Thyroid/Neck Smooth without nodularity or enlargement. Normal ROM.  Neck Supple.  ?Skin No rashes, lesions or ulceration. Normal palpated skin turgor. No nodularity.  ?Breasts: No masses or discharge.  Symmetric.  No axillary adenopathy.  ?Lungs: Clear to auscultation.No rales or wheezes. Normal Respiratory effort, no retractions.  ?Heart: NSR.  No  murmurs or rubs appreciated. No periferal edema  ?Abdomen: Soft.   Non-tender.  No masses.  No HSM. No hernia  ?Extremities: Moves all appropriately.  Normal ROM for age. No lymphadenopathy.  ?Neuro: Oriented to PPT.  Normal mood. Normal affect.  ? ?  Pelvic:   ?Vulva: Normal appearance.  No lesions.  ?Vagina: No lesions or abnormalities noted.  ?Support: Second-degree cystocele rectocele with some loss of perineal body.  ?Urethra No masses tenderness or scarring.  ?Meatus Normal size without lesions or prolapse.  ?Cervix: Normal appearance.  No lesions.  ?Anus: Normal exam.  No lesions.  ?Perineum: Normal exam.  No lesions.  ?      Bimanual   ?Uterus: Normal size.  Moderately tender mobile.  AV.  ?Adnexae: No masses.  Right side mildly tender to palpation  ?Cul-de-sac: Negative for abnormality.  ? ? ? ?Assessment:  ?  ?[id number] ?Patient Active Problem List  ? Diagnosis Date Noted  ? Seasonal allergies 03/20/2015  ? GERD (gastroesophageal reflux disease) 03/20/2015  ? Atrial tachycardia ([place]) 11/25/2011  ? ?  ?1. Well woman exam with routine gynecological exam   ?2. Pelvic pain in female   ? ? Patient may be having heavy menstrual bleeding and significant dysmenorrhea secondary to uterine fibroid. ? ? ?Plan:  ?  ?       ? 1.  Basic Screening Recommendations ?The basic screening recommendations for asymptomatic women were discussed with the patient during her visit.  The age-appropriate recommendations were discussed with her and the rational for the tests reviewed.  When I am informed by the patient that another primary care physician has previously obtained the age-appropriate tests and they are up-to-date, only outstanding tests are ordered and referrals given as necessary.  Abnormal results of tests will be discussed with her when all of her results are completed.  Routine preventative health maintenance measures emphasized: Exercise/Diet/Weight control, Tobacco Warnings, Alcohol/Substance use risks and Stress Management ?Patient up-to-date ?2.  Ultrasound for pelvic pain to rule  out ovarian cyst and check uterine fibroid. ?3.  Methods of cycle control and control of dysmenorrhea discussed in detail.  Special emphasis with OCPs and IUD. (She is not particularly interested in OCPs as she took them when she was younger and they had a significant mood change effect on her.) ? ?Orders ?Orders Placed This Encounter  ?Procedures  ? US PELVIS (TRANSABDOMINAL ONLY)  ? Basic metabolic panel  ? CBC  ? TSH  ? Lipid panel  ? Hemoglobin A1c  ? ? No orders of the defined types were placed in this encounter. ?    ?  ?  F/U ? Return for We will contact her with any abnormal test results. ? ?Finis Bud, M.D. ?07/17/2021 ?11:46 AM ? ? ? ?

## 2021-07-17 NOTE — Progress Notes (Signed)
Patients presents for annual exam today. Patient states concerns of heavy menstrual cycles with cramping along with R sided breast pain. Patient is up to date on pap smear and mammogram. She states she will return for labs when fasting. Patient states no other questions or concerns at this time.   ?

## 2021-07-18 ENCOUNTER — Other Ambulatory Visit: Payer: Self-pay | Admitting: Obstetrics and Gynecology

## 2021-07-18 ENCOUNTER — Ambulatory Visit (INDEPENDENT_AMBULATORY_CARE_PROVIDER_SITE_OTHER): Payer: 59

## 2021-07-18 ENCOUNTER — Other Ambulatory Visit: Payer: 59

## 2021-07-18 DIAGNOSIS — R102 Pelvic and perineal pain: Secondary | ICD-10-CM | POA: Diagnosis not present

## 2021-07-19 LAB — CBC
Hematocrit: 41.6 % (ref 34.0–46.6)
Hemoglobin: 14.2 g/dL (ref 11.1–15.9)
MCH: 30.2 pg (ref 26.6–33.0)
MCHC: 34.1 g/dL (ref 31.5–35.7)
MCV: 89 fL (ref 79–97)
Platelets: 280 10*3/uL (ref 150–450)
RBC: 4.7 x10E6/uL (ref 3.77–5.28)
RDW: 12.2 % (ref 11.7–15.4)
WBC: 5.4 10*3/uL (ref 3.4–10.8)

## 2021-07-19 LAB — BASIC METABOLIC PANEL
BUN/Creatinine Ratio: 13 (ref 9–23)
BUN: 12 mg/dL (ref 6–24)
CO2: 21 mmol/L (ref 20–29)
Calcium: 9 mg/dL (ref 8.7–10.2)
Chloride: 102 mmol/L (ref 96–106)
Creatinine, Ser: 0.93 mg/dL (ref 0.57–1.00)
Glucose: 92 mg/dL (ref 70–99)
Potassium: 4.8 mmol/L (ref 3.5–5.2)
Sodium: 136 mmol/L (ref 134–144)
eGFR: 78 mL/min/{1.73_m2} (ref >59)

## 2021-07-19 LAB — LIPID PANEL
Chol/HDL Ratio: 2.2 ratio (ref 0.0–4.4)
Cholesterol, Total: 146 mg/dL (ref 100–199)
HDL: 67 mg/dL (ref >39)
LDL Chol Calc (NIH): 68 mg/dL (ref 0–99)
Triglycerides: 47 mg/dL (ref 0–149)
VLDL Cholesterol Cal: 11 mg/dL (ref 5–40)

## 2021-07-19 LAB — HEMOGLOBIN A1C
Est. average glucose Bld gHb Est-mCnc: 103 mg/dL
Hgb A1c MFr Bld: 5.2 % (ref 4.8–5.6)

## 2021-07-19 LAB — TSH: TSH: 1.74 u[IU]/mL (ref 0.450–4.500)

## 2021-07-26 NOTE — Progress Notes (Signed)
Caitlin Castillo: ?Your ultrasound has returned and is entirely normal.  There is no findings on the ultrasound that would indicate a reason for your pain.  Interestingly enough, there is no evidence of uterine fibroids at this time either.

## 2021-12-04 ENCOUNTER — Telehealth: Payer: Self-pay

## 2021-12-04 NOTE — Telephone Encounter (Signed)
No and the 4pm is a 20 minute slot not a 40 minute new patient slot. She needs to be rescheduled and go to urgent care.

## 2021-12-04 NOTE — Telephone Encounter (Signed)
Is a virtual visit okay?

## 2021-12-04 NOTE — Telephone Encounter (Signed)
Copied from [place] [id number]. Topic: General - Inquiry >> Dec 04, 2021 11:09 AM Erskine Squibb wrote: Reason for CRM: Patient called in with lower abdominal pain. She last saw the provider in July of 2020 at the [facility] location. 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 She is currently scheduled for a virtual appt on Friday at 4 as a new patient as its been over 3 years. Please assist patient further.

## 2021-12-05 NOTE — Telephone Encounter (Signed)
Left message advising pt she will need to reschedule.  PEC please reschedule to the next available new pt slot.   Thanks,   -Mickel Baas

## 2021-12-07 ENCOUNTER — Telehealth: Payer: 59 | Admitting: Internal Medicine

## 2021-12-11 NOTE — Telephone Encounter (Addendum)
Pt called back upset that her appointment was canceled. Pt declined to reschedule new patient appointment at this time. Pt mentioned abdominal pain and declined to speak with NT. 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  Call disconnected.   Pt also mentioned her appointment was marked as a no show and doesn't want to be penalized for it in the future.   Just FYI to office.

## 2022-01-30 ENCOUNTER — Telehealth: Payer: Self-pay

## 2022-01-30 NOTE — Telephone Encounter (Unsigned)
Copied from [place] [id number]. Topic: General - Inquiry >> Jan 29, 2022  2:54 PM Leilani Able wrote: Reason for CRM: This pt has called and had set up an appt in Aug but agent had set appt up wrong, Pt was just a couple weeks past cutoff time seeing Rollene Fare for PCP. Pt works in [place] at [employer] and had worked with Rollene Fare and was hoping to not have to wait till April appt she had scheduled but is willing to wait if she has too. Agent did err and now 2 more months past, appt set for mid-April. If anything comes available she would appreciate any consideration. [phone]

## 2022-01-31 NOTE — Telephone Encounter (Signed)
I can see her next Wednesday at 1120.

## 2022-02-05 ENCOUNTER — Ambulatory Visit: Payer: 59 | Admitting: Dermatology

## 2022-02-05 DIAGNOSIS — L814 Other melanin hyperpigmentation: Secondary | ICD-10-CM

## 2022-02-05 DIAGNOSIS — L649 Androgenic alopecia, unspecified: Secondary | ICD-10-CM | POA: Diagnosis not present

## 2022-02-05 DIAGNOSIS — D239 Other benign neoplasm of skin, unspecified: Secondary | ICD-10-CM

## 2022-02-05 DIAGNOSIS — B360 Pityriasis versicolor: Secondary | ICD-10-CM

## 2022-02-05 DIAGNOSIS — L578 Other skin changes due to chronic exposure to nonionizing radiation: Secondary | ICD-10-CM

## 2022-02-05 DIAGNOSIS — L821 Other seborrheic keratosis: Secondary | ICD-10-CM

## 2022-02-05 DIAGNOSIS — Z1283 Encounter for screening for malignant neoplasm of skin: Secondary | ICD-10-CM

## 2022-02-05 DIAGNOSIS — L858 Other specified epidermal thickening: Secondary | ICD-10-CM

## 2022-02-05 DIAGNOSIS — D2362 Other benign neoplasm of skin of left upper limb, including shoulder: Secondary | ICD-10-CM | POA: Diagnosis not present

## 2022-02-05 DIAGNOSIS — D1800 Hemangioma unspecified site: Secondary | ICD-10-CM

## 2022-02-05 DIAGNOSIS — Z808 Family history of malignant neoplasm of other organs or systems: Secondary | ICD-10-CM

## 2022-02-05 MED ORDER — MINOXIDIL 2.5 MG PO TABS: 2.5000 mg | ORAL_TABLET | Freq: Every day | ORAL | 3 refills | Status: DC

## 2022-02-05 MED ORDER — SELENIUM SULFIDE 2.5 % EX LOTN: TOPICAL_LOTION | CUTANEOUS | 5 refills | Status: DC

## 2022-02-05 MED ORDER — SPIRONOLACTONE 100 MG PO TABS: 100.0000 mg | ORAL_TABLET | Freq: Every day | ORAL | 3 refills | Status: DC

## 2022-02-05 NOTE — Patient Instructions (Addendum)
Tinea versicolor is a chronic recurrent skin rash causing discolored scaly spots most commonly seen on back, chest, and/or shoulders.  It is generally asymptomatic. The rash is due to overgrowth of a common type of yeast present on everyone's skin and it is not contagious.  It tends to flare more in the summer due to increased sweating on trunk.  After rash is treated, the scaliness will resolve, but the discoloration will take longer to return to normal pigmentation. The periodic use of an OTC medicated soap/shampoo with zinc or selenium sulfide can be helpful to prevent yeast overgrowth and recurrence.    Female Androgenic Alopecia is a chronic condition related to genetics and/or hormonal changes.  In women androgenetic alopecia is commonly associated with menopause but may occur any time after puberty.  It causes hair thinning primarily on the crown with widening of the part and temporal hairline recession.  Can use OTC Rogaine (minoxidil) 5% solution/foam as directed.  Oral treatments in female patients who have no contraindication may include : - Low dose oral minoxidil 1.25 - '5mg'$  daily - Spironolactone 50 - '100mg'$  bid - Finasteride 2.5 - 5 mg daily Adjunctive therapies include: - Low Level Laser Light Therapy (LLLT) - Platelet-rich plasma injections (PRP) - Hair Transplants or scalp reduction  Start minoxidil 2.5 mg- take 1/2 tab by mouth daily Start spironolactone 100 mg tab - take 1/2 by mouth daily as tolerated.    Doses of minoxidil for hair loss are considered 'low dose'. This is because the doses used for hair loss are a lot lower than the doses which are used for conditions such as high blood pressure (hypertension). The doses used for hypertension are 10-'40mg'$  per day.  Side effects are uncommon at the low doses (up to 2.5 mg/day) used to treat hair loss. Potential side effects, more commonly seen at higher doses, include: Increase in hair growth (hypertrichosis) elsewhere on face and  body Temporary hair shedding upon starting medication which may last up to 4 weeks Ankle swelling, fluid retention, rapid weight gain more than 5 pounds Low blood pressure and feeling lightheaded or dizzy when standing up quickly Fast or irregular heartbeat Headaches   Spironolactone can cause increased urination and cause blood pressure to decrease. Please watch for signs of lightheadedness and be cautious when changing position. It can sometimes cause breast tenderness or an irregular period in premenopausal women. It can also increase potassium. The increase in potassium usually is not a concern unless you are taking other medicines that also increase potassium, so please be sure your doctor knows all of the other medications you are taking. This medication should not be taken by pregnant women.  This medicine should also not be taken together with sulfa drugs like Bactrim (trimethoprim/sulfamethexazole).       Melanoma ABCDEs  Melanoma is the most dangerous type of skin cancer, and is the leading cause of death from skin disease.  You are more likely to develop melanoma if you: Have light-colored skin, light-colored eyes, or red or blond hair Spend a lot of time in the sun Tan regularly, either outdoors or in a tanning bed Have had blistering sunburns, especially during childhood Have a close family member who has had a melanoma Have atypical moles or large birthmarks  Early detection of melanoma is key since treatment is typically straightforward and cure rates are extremely high if we catch it early.   The first sign of melanoma is often a change in a mole or  a new dark spot.  The ABCDE system is a way of remembering the signs of melanoma.  A for asymmetry:  The two halves do not match. B for border:  The edges of the growth are irregular. C for color:  A mixture of colors are present instead of an even brown color. D for diameter:  Melanomas are usually (but not always) greater  than 6m - the size of a pencil eraser. E for evolution:  The spot keeps changing in size, shape, and color.  Please check your skin once per month between visits. You can use a small mirror in front and a large mirror behind you to keep an eye on the back side or your body.   If you see any new or changing lesions before your next follow-up, please call to schedule a visit.  Please continue daily skin protection including broad spectrum sunscreen SPF 30+ to sun-exposed areas, reapplying every 2 hours as needed when you're outdoors.   Staying in the shade or wearing long sleeves, sun glasses (UVA+UVB protection) and wide brim hats (4-inch brim around the entire circumference of the hat) are also recommended for sun protection.    Due to recent changes in healthcare laws, you may see results of your pathology and/or laboratory studies on MyChart before the doctors have had a chance to review them. We understand that in some cases there may be results that are confusing or concerning to you. Please understand that not all results are received at the same time and often the doctors may need to interpret multiple results in order to provide you with the best plan of care or course of treatment. Therefore, we ask that you please give u[place]2 business days to thoroughly review all your results before contacting the office for clarification. Should we see a critical lab result, you will be contacted sooner.   If You Need Anything After Your Visit  If you have any questions or concerns for your doctor, please call our main line at [phone] press option 4 to reach your doctor's medical assistant. If no one answers, please leave a voicemail as directed and we will return your call as soon as possible. Messages left after 4 pm will be answered the following business day.   You may also send [place] message via [place] We typically respond to MyChart messages within 1-2 business days.  For prescription  refills, please ask your pharmacy to contact our office. Our fax number is [phone]  If you have an urgent issue when the clinic is closed that cannot wait until the next business day, you can page your doctor at the number below.    Please note that while we do our best to be available for urgent issues outside of office hours, we are not available 24/7.   If you have an urgent issue and are unable to reach u[place] you may choose to seek medical care at your doctor's office, retail clinic, urgent care center, or emergency room.  If you have a medical emergency, please immediately call 911 or go to the emergency department.  Pager Numbers  - Dr. KNehemiah Massed [phone] - Dr. MLaurence Ferrari [phone] - Dr. SNicole Kindred [phone] In the event of inclement weather, please call our main line at [phone] an update on the status of any delays or closures.  Dermatology Medication Tips: Please keep the boxes that topical medications come in in order to help keep track of the instructions  about where and how to use these. Pharmacies typically print the medication instructions only on the boxes and not directly on the medication tubes.   If your medication is too expensive, please contact our office at [phone] option 4 or send [place] a message through [place].   We are unable to tell what your co-pay for medications will be in advance as this is different depending on your insurance coverage. However, we may be able to find a substitute medication at lower cost or fill out paperwork to get insurance to cover a needed medication.   If a prior authorization is required to get your medication covered by your insurance company, please allow [place] 1-2 business days to complete this process.  Drug prices often vary depending on where the prescription is filled and some pharmacies may offer cheaper prices.  The website www.goodrx.com contains coupons for medications through different pharmacies. The  prices here do not account for what the cost may be with help from insurance (it may be cheaper with your insurance), but the website can give you the price if you did not use any insurance.  - You can print the associated coupon and take it with your prescription to the pharmacy.  - You may also stop by our office during regular business hours and pick up a GoodRx coupon card.  - If you need your prescription sent electronically to a different pharmacy, notify our office through [facility] or by phone at [phone] option 4.     Si Usted Necesita Algo Despus de Su Visita  Tambin puede enviarnos un mensaje a travs de Pharmacist, community. Por lo general respondemos a los mensajes de MyChart en el transcurso de 1 a 2 das hbiles.  Para renovar recetas, por favor pida a su farmacia que se ponga en contacto con nuestra oficina. Harland Dingwall de fax es [place] [phone].  Si tiene un asunto urgente cuando la clnica est cerrada y que no puede esperar hasta el siguiente da hbil, puede llamar/localizar a su doctor(a) al nmero que aparece a continuacin.   Por favor, tenga en cuenta que aunque hacemos todo lo posible para estar disponibles para asuntos urgentes fuera del horario de [place], no estamos disponibles las 24 horas del da, los 7 das de la [place].   Si tiene un problema urgente y no puede comunicarse con nosotros, puede optar por buscar atencin mdica  en el consultorio de su doctor(a), en una clnica privada, en un centro de atencin urgente o en una sala de emergencias.  Si tiene Engineering geologist, por favor llame inmediatamente al 911 o vaya a la sala de emergencias.  Nmeros de bper  - Dr. Nehemiah Massed: [phone]  - Dra. Moye: [phone]  - Dra. Nicole Kindred: [phone]  En caso de inclemencias del [place], por favor llame a Johnsie Kindred principal al [phone] para una actualizacin sobre el [place] de cualquier retraso o cierre.  Consejos para la medicacin en  dermatologa: Por favor, guarde las cajas en las que vienen los medicamentos de uso tpico para ayudarle a seguir las instrucciones sobre dnde y cmo usarlos. Las farmacias generalmente imprimen las instrucciones del medicamento slo en las cajas y no directamente en los tubos del [place].   Si su medicamento es muy caro, por favor, pngase en contacto con Zigmund Daniel llamando al [phone] y presione la opcin 4 o envenos un mensaje a travs de Pharmacist, community.   No podemos decirle cul ser su copago por los medicamentos por adelantado ya que  esto es diferente dependiendo de la cobertura de su seguro. Sin embargo, es posible que podamos encontrar un medicamento sustituto a Electrical engineer un formulario para que el seguro cubra el medicamento que se considera necesario.   Si se requiere una autorizacin previa para que su compaa de seguros [place] su medicamento, por favor permtanos de 1 a 2 das hbiles para completar este proceso.  Los precios de los medicamentos varan con frecuencia dependiendo del Environmental consultant de dnde se surte la receta y alguna farmacias pueden ofrecer precios ms baratos.  El sitio web www.goodrx.com tiene cupones para medicamentos de Airline pilot. Los precios aqu no tienen en cuenta lo que podra costar con la ayuda del seguro (puede ser ms barato con su seguro), pero el sitio web puede darle el precio si no utiliz Research scientist (physical sciences).  - Puede imprimir el cupn correspondiente y llevarlo con su receta a la farmacia.  - Tambin puede pasar por nuestra oficina durante el horario de atencin regular y Charity fundraiser una tarjeta de cupones de GoodRx.  - Si necesita que su receta se enve electrnicamente a una farmacia diferente, informe a nuestra oficina a travs de MyChart de [place] o por telfono llamando al [phone] y presione la opcin 4.

## 2022-02-05 NOTE — Progress Notes (Signed)
Follow-Up Visit   Subjective  Caitlin Castillo is a 46 y.o. female who presents for the following: Annual Exam (blue nevus patient is concerned about, gradual hair thinning more over past 6 months, but going on for couple years. Reports maternal side has history of hair loss. ).  No recent illness or surgery.  No new medications.  She has had her thyroid checked and it is normal.  No problems with anemia.  She has balanced diet and eats plenty of protein and meat.  She does not have an associated rash in the scalp.  The patient presents for Total-Body Skin Exam (TBSE) for skin cancer screening and mole check.  The patient has spots, moles and lesions to be evaluated, some may be new or changing and the patient has concerns that these could be cancer.   The following portions of the chart were reviewed this encounter and updated as appropriate:      Review of Systems: No other skin or systemic complaints except as noted in HPI or Assessment and Plan.   Objective  Well appearing patient in no apparent distress; mood and affect are within normal limits.  A full examination was performed including scalp, head, eyes, ears, nose, lips, neck, chest, axillae, abdomen, back, buttocks, bilateral upper extremities, bilateral lower extremities, hands, feet, fingers, toes, fingernails, and toenails. All findings within normal limits unless otherwise noted below.  Mid Back Hypopigmented scaly patches and macules at back  left upper arm 2.33m med dark gray/blue papule, regular border and pigment.  Photo compared no changes.  Scalp Diffuse thinning of the crown and widening of the midline part with retention of the frontal hairline - Reviewed progressive nature and prognosis.             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 Condition is bothersome/symptomatic for patient. Currently flared.    Tinea versicolor is a chronic recurrent skin rash causing discolored scaly spots most commonly seen on back, chest, and/or shoulders.  It is generally asymptomatic. The rash is due to overgrowth of a common type of yeast present on everyone's skin and it is not contagious.  It tends to flare more in the summer due to increased sweating on trunk.  After rash is treated, the scaliness will resolve, but the discoloration will take longer to return to normal pigmentation. The periodic use of an OTC medicated soap/shampoo with zinc or selenium sulfide can be helpful to prevent yeast overgrowth and recurrence.   Start S[place]un 2.5 % shampoo - apply topically to aa leave on overnight and wash off next morning x 3 days. Can use qd in shower prn.  Let sit several minutes prior to rinsing    selenium sulfide (SELSUN) 2.5 % shampoo - Mid Back Apply topically to aa leave on overnight and wash off next morning 3 x weekly. Can use qd prn  Blue nevus left upper arm  Benign-appearing. Stable compared to previous visit. Observation.  Call clinic for new or changing moles.  Recommend daily use of broad spectrum spf 30+ sunscreen to sun-exposed areas.    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 Condition is bothersome/symptomatic for patient. Currently flared.   Female Androgenic Alopecia is a chronic condition related to genetics and/or hormonal changes.  In women androgenetic alopecia is commonly associated with menopause but may occur any time after  puberty.  It causes hair thinning primarily on the crown with widening of the part and temporal hairline recession.  Can use OTC Rogaine (minoxidil) 5% solution/foam as directed.  Oral treatments in female patients who have no contraindication may include : - Low dose oral minoxidil 1.25 - '5mg'$  daily - Spironolactone 50 - '100mg'$  bid - Finasteride 2.5 - 5 mg daily Adjunctive therapies  include: - Low Level Laser Light Therapy (LLLT) - Platelet-rich plasma injections (PRP) - Hair Transplants or scalp reduction  Reviewed prior labs TSH, CMP, CBC was normal   BP today 130/80  Start Minoxidil 2.5 mg tab take 1/2 pill PO qd.  Start Spironolactone 100 mg tab take 1/2 pill PO qd as tolerated, may increase to 1 pill daily if no side effects  Doses of minoxidil for hair loss are considered 'low dose'. This is because the doses used for hair loss are a lot lower than the doses which are used for conditions such as high blood pressure (hypertension). The doses used for hypertension are 10-'40mg'$  per day.  Side effects are uncommon at the low doses (up to 2.5 mg/day) used to treat hair loss. Potential side effects, more commonly seen at higher doses, include: Increase in hair growth (hypertrichosis) elsewhere on face and body Temporary hair shedding upon starting medication which may last up to 4 weeks Ankle swelling, fluid retention, rapid weight gain more than 5 pounds Low blood pressure and feeling lightheaded or dizzy when standing up quickly Fast or irregular heartbeat Headaches  Spironolactone can cause increased urination and cause blood pressure to decrease. Please watch for signs of lightheadedness and be cautious when changing position. It can sometimes cause breast tenderness or an irregular period in premenopausal women. It can also increase potassium. The increase in potassium usually is not a concern unless you are taking other medicines that also increase potassium, so please be sure your doctor knows all of the other medications you are taking. This medication should not be taken by pregnant women.  This medicine should also not be taken together with sulfa drugs like Bactrim (trimethoprim/sulfamethexazole).    minoxidil (LONITEN) 2.5 MG tablet - Scalp Take 1 tablet (2.5 mg total) by mouth daily.  spironolactone (ALDACTONE) 100 MG tablet - Scalp Take 1 tablet (100 mg  total) by mouth daily.  Lentigines - Scattered tan macules - Due to sun exposure - Benign-appearing, observe - Recommend daily broad spectrum sunscreen SPF 30+ to sun-exposed areas, reapply every 2 hours as needed. - Call for any changes  Seborrheic Keratoses - Stuck-on, waxy, tan-brown papules and/or plaques  - Benign-appearing - Discussed benign etiology and prognosis. - Observe - Call for any changes  Melanocytic Nevi - Tan-brown and/or pink-flesh-colored symmetric macules and papules - Benign appearing on exam today - Observation - Call clinic for new or changing moles - Recommend daily use of broad spectrum spf 30+ sunscreen to sun-exposed areas.   Keratosis Pilaris At arms  - Tiny follicular keratotic papules - Benign. Genetic in nature. No cure. - Observe. - If desired, patient can use an emollient (moisturizer) containing ammonium lactate, urea or salicylic acid once a day to smooth the area  Hemangiomas - Red papules - Discussed benign nature - Observe - Call for any changes  Actinic Damage - Chronic condition, secondary to cumulative UV/sun exposure - diffuse scaly erythematous macules with underlying dyspigmentation - Recommend daily broad spectrum sunscreen SPF 30+ to sun-exposed areas, reapply every 2 hours as needed.  - Staying in the  shade or wearing long sleeves, sun glasses (UVA+UVB protection) and wide brim hats (4-inch brim around the entire circumference of the hat) are also recommended for sun protection.  - Call for new or changing lesions.  Family History of Skin Cancer  History of melanoma in mother's sister    Skin cancer screening performed today. Return for 3 month alopecia follow up, 1 year tbse . I, Ruthell Rummage, CMA, am acting as scribe for Brendolyn Patty, MD.  Documentation: I have reviewed the above documentation for accuracy and completeness, and I agree with the above.  Brendolyn Patty MD

## 2022-02-06 ENCOUNTER — Ambulatory Visit: Payer: 59 | Admitting: Internal Medicine

## 2022-02-06 ENCOUNTER — Encounter: Payer: Self-pay | Admitting: Internal Medicine

## 2022-02-06 ENCOUNTER — Ambulatory Visit: Payer: 59 | Admitting: Dermatology

## 2022-02-06 DIAGNOSIS — K5909 Other constipation: Secondary | ICD-10-CM

## 2022-02-06 DIAGNOSIS — R1031 Right lower quadrant pain: Secondary | ICD-10-CM | POA: Diagnosis not present

## 2022-02-06 DIAGNOSIS — K219 Gastro-esophageal reflux disease without esophagitis: Secondary | ICD-10-CM | POA: Diagnosis not present

## 2022-02-06 DIAGNOSIS — Z6825 Body mass index (BMI) 25.0-25.9, adult: Secondary | ICD-10-CM

## 2022-02-06 DIAGNOSIS — I4719 Other supraventricular tachycardia: Secondary | ICD-10-CM | POA: Diagnosis not present

## 2022-02-06 DIAGNOSIS — G8929 Other chronic pain: Secondary | ICD-10-CM

## 2022-02-06 DIAGNOSIS — E663 Overweight: Secondary | ICD-10-CM

## 2022-02-06 MED ORDER — LINACLOTIDE 72 MCG PO CAPS: 72.0000 ug | ORAL_CAPSULE | Freq: Every day | ORAL | 1 refills | Status: DC

## 2022-02-06 NOTE — Progress Notes (Signed)
HPI  Patient presents to clinic today to reestablish care and for management of the conditions listed below  GERD: She is not sure what triggers this. She takes Gaviscon OTC with good relief of symptoms.  There is no upper GI on file.  Chronic Constipation: Managed with "athletic greens" OTC with good results.  Atrial Tachycardia: She is not currently taking any medications for this.  ECG from 10/2011 reviewed.  She also reports persistent RLQ abdominal pain.  This is intermittent.  She describes the pain as sharp and stabbing.  The pain does not really radiate.  The pain occurs even when she is not on her menses.  She has seen GYN in the past for the same.  She has been told that she has endometriosis.  Past Medical History:  Diagnosis Date   Allergy    Anxiety    mild   Chronic constipation    Endometriosis    Fibrocystic breast    Ovarian cyst    Pelvic pain in female    Right lower quadrant pain     Current Outpatient Medications  Medication Sig Dispense Refill   minoxidil (LONITEN) 2.5 MG tablet Take 1 tablet (2.5 mg total) by mouth daily. 30 tablet 3   selenium sulfide (SELSUN) 2.5 % shampoo Apply topically to aa leave on overnight and wash off next morning 3 x weekly. Can use qd prn 118 mL 5   spironolactone (ALDACTONE) 100 MG tablet Take 1 tablet (100 mg total) by mouth daily. 30 tablet 3   No current facility-administered medications for this visit.    Allergies  Allergen Reactions   Citrus    Shellfish Allergy    Tape    Metrogel [Metronidazole] Rash    ??    Tylenol With Codeine #3 [Acetaminophen-Codeine] Rash    Family History  Problem Relation Age of Onset   Prostate cancer Maternal Grandfather    Brain cancer Maternal Grandfather    Stroke Maternal Grandfather    Endometriosis Mother    Drug abuse Maternal Aunt    Drug abuse Maternal Uncle    Colon cancer Neg Hx    Ovarian cancer Neg Hx    Cancer Neg Hx    Diabetes Neg Hx    Heart disease Neg Hx     Breast cancer Neg Hx     Social History   Socioeconomic History   Marital status: Married    Spouse name: Not on file   Number of children: Not on file   Years of education: Not on file   Highest education level: Not on file  Occupational History   Not on file  Tobacco Use   Smoking status: Never   Smokeless tobacco: Never  Vaping Use   Vaping Use: Never used  Substance and Sexual Activity   Alcohol use: Yes    Alcohol/week: 0.0 standard drinks of alcohol    Comment: occasional   Drug use: No   Sexual activity: Yes    Birth control/protection: Surgical    Comment: vasctomy  Other Topics Concern   Not on file  Social History Narrative   ** Merged History Encounter **       Social Determinants of Health   Financial Resource Strain: Not on file  Food Insecurity: Not on file  Transportation Needs: Not on file  Physical Activity: Insufficiently Active (07/07/2017)   Exercise Vital Sign    Days of Exercise per Week: 2 days    Minutes of Exercise per Session:  30 min  Stress: Not on file  Social Connections: Not on file  Intimate Partner Violence: Not on file    ROS:  Constitutional: Denies fever, malaise, fatigue, headache or abrupt weight changes.  HEENT: Denies eye pain, eye redness, ear pain, ringing in the ears, wax buildup, runny nose, nasal congestion, bloody nose, or sore throat. Respiratory: Denies difficulty breathing, shortness of breath, cough or sputum production.   Cardiovascular: Denies chest pain, chest tightness, palpitations or swelling in the hands or feet.  Gastrointestinal: Pt reports chronic constipation, chronic right side abdominal pain. Denies bloating, diarrhea or blood in the stool.  GU: Pt reports heavy menses. Denies frequency, urgency, pain with urination, blood in urine, odor or discharge. Musculoskeletal: Denies decrease in range of motion, difficulty with gait, muscle pain or joint pain and swelling.  Skin: Pt reports thinning hair.  Denies redness, rashes, lesions or ulcercations.  Neurological: Denies dizziness, difficulty with memory, difficulty with speech or problems with balance and coordination.  Psych: Denies anxiety, depression, SI/HI.  No other specific complaints in a complete review of systems (except as listed in HPI above).  PE: BP 134/86 (BP Location: Left Arm, Patient Position: Sitting, Cuff Size: Normal)   Pulse 62   Temp (!) 96.9 F (36.1 C) (Temporal)   Ht '5\' 8"'$  (1.727 m)   Wt 168 lb (76.2 kg)   SpO2 100%   BMI 25.54 kg/m   Wt Readings from Last 3 Encounters:  07/17/21 170 lb (77.1 kg)  12/02/19 173 lb 1.6 oz (78.5 kg)  07/23/19 176 lb 2.4 oz (79.9 kg)    General: Appears her stated age, overweight, in NAD. HEENT: Head: normal shape and size; Eyes: sclera white, no icterus, conjunctiva pink, PERRLA and EOMs intact;  Cardiovascular: Normal rate and rhythm. S1,S2 noted.  No murmur, rubs or gallops noted. No JVD or BLE edema.  Pulmonary/Chest: Normal effort and positive vesicular breath sounds. No respiratory distress. No wheezes, rales or ronchi noted.  Abdomen: Soft and tender in the RLQ near the groin. Normal bowel sounds. No distention or masses noted.  Musculoskeletal: No difficulty with gait.  Neurological: Alert and oriented.  Psychiatric: Mood and affect normal. Behavior is normal. Judgment and thought content normal.    BMET    Component Value Date/Time   NA 136 07/18/2021 1119   K 4.8 07/18/2021 1119   CL 102 07/18/2021 1119   CO2 21 07/18/2021 1119   GLUCOSE 92 07/18/2021 1119   GLUCOSE 97 01/01/2018 1019   BUN 12 07/18/2021 1119   CREATININE 0.93 07/18/2021 1119   CALCIUM 9.0 07/18/2021 1119    Lipid Panel     Component Value Date/Time   CHOL 146 07/18/2021 1119   TRIG 47 07/18/2021 1119   HDL 67 07/18/2021 1119   CHOLHDL 2.2 07/18/2021 1119   LDLCALC 68 07/18/2021 1119    CBC    Component Value Date/Time   WBC 5.4 07/18/2021 1119   WBC 11.9 (H) 02/14/2012  0359   RBC 4.70 07/18/2021 1119   RBC 4.29 02/14/2012 0359   HGB 14.2 07/18/2021 1119   HCT 41.6 07/18/2021 1119   PLT 280 07/18/2021 1119   MCV 89 07/18/2021 1119   MCV 82 02/14/2012 0359   MCH 30.2 07/18/2021 1119   MCH 27.2 02/14/2012 0359   MCHC 34.1 07/18/2021 1119   MCHC 33.2 02/14/2012 0359   RDW 12.2 07/18/2021 1119   RDW 13.9 02/14/2012 0359   LYMPHSABS 2.3 02/14/2012 0359   MONOABS 0.6  02/14/2012 0359   EOSABS 0.0 02/14/2012 0359   BASOSABS 0.0 02/14/2012 0359    Hgb A1C Lab Results  Component Value Date   HGBA1C 5.2 07/18/2021     Assessment and Plan:   RTC in 6 months for annual exam Webb Silversmith, NP

## 2022-02-11 DIAGNOSIS — E663 Overweight: Secondary | ICD-10-CM | POA: Insufficient documentation

## 2022-02-11 DIAGNOSIS — Z6826 Body mass index (BMI) 26.0-26.9, adult: Secondary | ICD-10-CM | POA: Insufficient documentation

## 2022-02-11 DIAGNOSIS — K5909 Other constipation: Secondary | ICD-10-CM | POA: Insufficient documentation

## 2022-02-11 DIAGNOSIS — G8929 Other chronic pain: Secondary | ICD-10-CM | POA: Insufficient documentation

## 2022-02-11 NOTE — Assessment & Plan Note (Signed)
Try to identify and avoid foods that cause reflux Continue Gaviscon as needed

## 2022-02-11 NOTE — Assessment & Plan Note (Signed)
Not medicated We will monitor 

## 2022-02-11 NOTE — Assessment & Plan Note (Signed)
Encourage diet and exercise weight loss 

## 2022-02-11 NOTE — Patient Instructions (Signed)

## 2022-02-11 NOTE — Assessment & Plan Note (Signed)
We will trial Linzess 72 mcg daily Encourage high-fiber diet and adequate water intake

## 2022-02-11 NOTE — Assessment & Plan Note (Signed)
We will see if improves with treatment of Linzess If persist, would recommend going back to GYN to discuss ex lap for further evaluation as previously discussed

## 2022-02-15 ENCOUNTER — Encounter: Payer: Self-pay | Admitting: Internal Medicine

## 2022-04-04 ENCOUNTER — Ambulatory Visit (INDEPENDENT_AMBULATORY_CARE_PROVIDER_SITE_OTHER): Payer: 59 | Admitting: Internal Medicine

## 2022-04-04 ENCOUNTER — Encounter: Payer: Self-pay | Admitting: Internal Medicine

## 2022-04-04 VITALS — BP 110/72 | HR 73 | Temp 97.1°F | Wt 178.0 lb

## 2022-04-04 DIAGNOSIS — R635 Abnormal weight gain: Secondary | ICD-10-CM

## 2022-04-04 DIAGNOSIS — N644 Mastodynia: Secondary | ICD-10-CM | POA: Diagnosis not present

## 2022-04-04 DIAGNOSIS — N926 Irregular menstruation, unspecified: Secondary | ICD-10-CM

## 2022-04-04 NOTE — Patient Instructions (Signed)
Menorrhagia Menorrhagia is when your monthly periods are heavy or last longer than normal. If you have this condition, bleeding and cramping may make it hard for you to do your daily activities. What are the causes? Common causes of this condition include: Growths in the womb (uterus). These are polyps or fibroids. These growths are not cancer. Problems with two hormones called estrogen and progesterone. One of the ovaries not releasing an egg during one or more months. A problem with the thyroid gland. Having a device for birth control (IUD). Side effects of some medicines, such as NSAIDs or blood thinners. A disorder that stops the blood from clotting normally. What increases the risk? You are more likely to have this condition if you have cancer of the womb. What are the signs or symptoms? Having to change your pad or tampon every 1-2 hours because it is soaked. Needing to use pads and tampons at the same time because of heavy bleeding. Needing to wake up to change your pads or tampons during the night. Passing blood clots larger than 1 inch (2.5 cm) in size. Having bleeding that lasts for more than 7 days. Having symptoms of low iron levels (anemia), such as feeling tired or having shortness of breath. How is this treated? You may not need to be treated for this condition. But if you need treatment, you may be given medicines: To reduce bleeding during your period. These include birth control medicines. To make your blood thick. This slows bleeding. To reduce swelling. Medicines that do this include ibuprofen. That have a hormone called progestin. That make the ovaries stop working for a short time. To treat low iron levels. You will be given iron pills if you have this condition. If medicines do not work, surgery may be done. Surgery may be done to: Remove a part of the lining of the womb. This lining is called the endometrium. This reduces bleeding during a period. Remove growths  in the womb. These may be polyps or fibroids. Remove the entire lining of the womb. Remove the womb entirely. This procedure is called a hysterectomy. Follow these instructions at home: Medicines Take over-the-counter and prescription medicines only as told by your doctor. This includes iron pills. Do not change or switch medicines without asking your doctor. Do not take aspirin or medicines that contain aspirin 1 week before or during your period. Aspirin may make bleeding worse. Managing constipation Iron pills may cause trouble pooping (constipation). To prevent or treat problems when pooping, you may need to: Drink enough fluid to keep your pee (urine) pale yellow. Take over-the-counter or prescription medicines. Eat foods that are high in fiber. These include beans, whole grains, and fresh fruits and vegetables. Limit foods that are high in fat and sugar. These include fried or sweet foods. General instructions If you need to change your pad or tampon more than once every 2 hours, limit your activity until the bleeding stops. Eat healthy meals and foods that are high in iron. Foods that have a lot of iron include: Leafy green vegetables. Meat. Liver. Eggs. Whole-grain breads and cereals. Do not try to lose weight until your heavy bleeding has stopped and you have normal amounts of iron in your blood. If you need to lose weight, work with your doctor. Keep all follow-up visits. Contact a doctor if: You soak through a pad or tampon every 1 or 2 hours, and this happens every time you have a period. You need to use pads and  tampons at the same time because you are bleeding so much. You are taking medicine, and: You feel like you may vomit. You vomit. You have watery poop (diarrhea). You have other problems that may be related to the medicine you are taking. Get help right away if: You soak through more than a pad or tampon in 1 hour. You pass clots bigger than 1 inch (2.5 cm)  wide. You feel short of breath. You feel like your heart is beating too fast. You feel dizzy or you faint. You feel very weak or tired. Summary Menorrhagia is when your menstrual periods are heavy or last longer than normal. You may not need to be treated for this condition. If you need treatment, you may be given medicines or have surgery. Take over-the-counter and prescription medicines only as told by your doctor. This includes iron pills. Get help right away if you soak through more than a pad or tampon in 1 hour or you pass large clots. Also, get help right away if you feel dizzy, short of breath, or very weak or tired. This information is not intended to replace advice given to you by your health care provider. Make sure you discuss any questions you have with your health care provider. Document Revised: 12/28/2019 Document Reviewed: 12/28/2019 Elsevier Patient Education  [place].

## 2022-04-04 NOTE — Progress Notes (Signed)
Subjective:    Patient ID: Caitlin Castillo, female    DOB: 1975/08/10, 46 y.o.   MRN: [id number]  HPI  Patient presents to clinic today with complaint of a missed menstrual cycle.  Her LMP was 02/23/22. She does have some breast tenderness, pelvic cramping and weight gain but denies fatigue, nausea. She was having regular periods prior to this. She does not feel stressed. She reports her mother went through menopause in her late 65's.  Review of Systems  Past Medical History:  Diagnosis Date   Allergy    Anxiety    mild   Chronic constipation    Endometriosis    Fibrocystic breast    Ovarian cyst    Pelvic pain in female    Right lower quadrant pain     Current Outpatient Medications  Medication Sig Dispense Refill   linaclotide (LINZESS) 72 MCG capsule Take 1 capsule (72 mcg total) by mouth daily before breakfast. 90 capsule 1   minoxidil (LONITEN) 2.5 MG tablet Take 1 tablet (2.5 mg total) by mouth daily. 30 tablet 3   selenium sulfide (SELSUN) 2.5 % shampoo Apply topically to aa leave on overnight and wash off next morning 3 x weekly. Can use qd prn 118 mL 5   spironolactone (ALDACTONE) 100 MG tablet Take 1 tablet (100 mg total) by mouth daily. 30 tablet 3   No current facility-administered medications for this visit.    Allergies  Allergen Reactions   Citrus    Shellfish Allergy    Tape    Metrogel [Metronidazole] Rash    ??    Tylenol With Codeine #3 [Acetaminophen-Codeine] Rash    Family History  Problem Relation Age of Onset   Prostate cancer Maternal Grandfather    Brain cancer Maternal Grandfather    Stroke Maternal Grandfather    Endometriosis Mother    Drug abuse Maternal Aunt    Drug abuse Maternal Uncle    Colon cancer Neg Hx    Ovarian cancer Neg Hx    Cancer Neg Hx    Diabetes Neg Hx    Heart disease Neg Hx    Breast cancer Neg Hx     Social History   Socioeconomic History   Marital status: Married    Spouse name: Not on file   Number  of children: Not on file   Years of education: Not on file   Highest education level: Not on file  Occupational History   Not on file  Tobacco Use   Smoking status: Never   Smokeless tobacco: Never  Vaping Use   Vaping Use: Never used  Substance and Sexual Activity   Alcohol use: Yes    Alcohol/week: 0.0 standard drinks of alcohol    Comment: occasional   Drug use: No   Sexual activity: Yes    Birth control/protection: Surgical    Comment: vasctomy  Other Topics Concern   Not on file  Social History Narrative   ** Merged History Encounter **       Social Determinants of Health   Financial Resource Strain: Not on file  Food Insecurity: Not on file  Transportation Needs: Not on file  Physical Activity: Insufficiently Active (07/07/2017)   Exercise Vital Sign    Days of Exercise per Week: 2 days    Minutes of Exercise per Session: 30 min  Stress: Not on file  Social Connections: Not on file  Intimate Partner Violence: Not on file     Constitutional: Pt  reports weight gain. Denies fever, malaise, fatigue, headache.  HEENT: Denies eye pain, eye redness, ear pain, ringing in the ears, wax buildup, runny nose, nasal congestion, bloody nose, or sore throat. Respiratory: Denies difficulty breathing, shortness of breath, cough or sputum production.   Cardiovascular: Denies chest pain, chest tightness, palpitations or swelling in the hands or feet.  Gastrointestinal: Denies abdominal pain, bloating, constipation, diarrhea or blood in the stool.  GU: Patient reports light menses.  Denies urgency, frequency, pain with urination, burning sensation, blood in urine, odor or discharge. Musculoskeletal: Denies decrease in range of motion, difficulty with gait, muscle pain or joint pain and swelling.  Skin: Pt reports breast tenderness. Denies redness, rashes, lesions or ulcercations.  Neurological: Denies dizziness, difficulty with memory, difficulty with speech or problems with balance  and coordination.  Psych: Denies anxiety, depression, SI/HI.  No other specific complaints in a complete review of systems (except as listed in HPI above).     Objective:   Physical Exam  BP 110/72 (BP Location: Left Arm, Patient Position: Sitting, Cuff Size: Normal)   Pulse 73   Temp (!) 97.1 F (36.2 C) (Oral)   Wt 178 lb (80.7 kg)   SpO2 100%   BMI 27.06 kg/m   Wt Readings from Last 3 Encounters:  02/06/22 168 lb (76.2 kg)  07/17/21 170 lb (77.1 kg)  12/02/19 173 lb 1.6 oz (78.5 kg)    General: Appears her stated age, overweight, in NAD. Neck:  Neck supple, trachea midline. No masses, lumps or thyromegaly present.  Cardiovascular: Normal rate and rhythm. S1,S2 noted.  No murmur, rubs or gallops noted.  Pulmonary/Chest: Normal effort and positive vesicular breath sounds. No respiratory distress. No wheezes, rales or ronchi noted.  Musculoskeletal: No difficulty with gait.  Neurological: Alert and oriented.  Psychiatric: Mood and affect normal. Mildly anxious appearing. Judgment and thought content normal.    BMET    Component Value Date/Time   NA 136 07/18/2021 1119   K 4.8 07/18/2021 1119   CL 102 07/18/2021 1119   CO2 21 07/18/2021 1119   GLUCOSE 92 07/18/2021 1119   GLUCOSE 97 01/01/2018 1019   BUN 12 07/18/2021 1119   CREATININE 0.93 07/18/2021 1119   CALCIUM 9.0 07/18/2021 1119    Lipid Panel     Component Value Date/Time   CHOL 146 07/18/2021 1119   TRIG 47 07/18/2021 1119   HDL 67 07/18/2021 1119   CHOLHDL 2.2 07/18/2021 1119   LDLCALC 68 07/18/2021 1119    CBC    Component Value Date/Time   WBC 5.4 07/18/2021 1119   WBC 11.9 (H) 02/14/2012 0359   RBC 4.70 07/18/2021 1119   RBC 4.29 02/14/2012 0359   HGB 14.2 07/18/2021 1119   HCT 41.6 07/18/2021 1119   PLT 280 07/18/2021 1119   MCV 89 07/18/2021 1119   MCV 82 02/14/2012 0359   MCH 30.2 07/18/2021 1119   MCH 27.2 02/14/2012 0359   MCHC 34.1 07/18/2021 1119   MCHC 33.2 02/14/2012 0359    RDW 12.2 07/18/2021 1119   RDW 13.9 02/14/2012 0359   LYMPHSABS 2.3 02/14/2012 0359   MONOABS 0.6 02/14/2012 0359   EOSABS 0.0 02/14/2012 0359   BASOSABS 0.0 02/14/2012 0359    Hgb A1C Lab Results  Component Value Date   HGBA1C 5.2 07/18/2021           Assessment & Plan:   Missed Menses, Breast Tenderness, Weight Gain:  Discussed possibility of new onset thyroid disorder, pregnancy,  lack of ovulation due to stress and perimenopause Will check serum hcg, tsh, fsh, lh, progesterone and estrogen  Will follow up after labs with further recommendation and treatment plan, RTC in 4 months for annual exam Webb Silversmith, NP

## 2022-04-10 LAB — HCG, QUANTITATIVE, PREGNANCY: HCG, Total, QN: <5 m[IU]/mL

## 2022-04-10 LAB — TSH: TSH: 2.93 mIU/L

## 2022-04-10 LAB — FSH/LH
FSH: 4 m[IU]/mL
LH: 1.6 m[IU]/mL

## 2022-04-10 LAB — PROGESTERONE: Progesterone: <0.5 ng/mL

## 2022-04-10 LAB — ESTROGENS, TOTAL: Estrogen: 221 pg/mL

## 2022-05-14 ENCOUNTER — Ambulatory Visit: Payer: 59 | Admitting: Dermatology

## 2022-05-14 VITALS — BP 103/73

## 2022-05-14 DIAGNOSIS — L649 Androgenic alopecia, unspecified: Secondary | ICD-10-CM | POA: Diagnosis not present

## 2022-05-14 NOTE — Progress Notes (Signed)
Follow-Up Visit   Subjective  Caitlin Castillo is a 47 y.o. female who presents for the following: Follow-up.  Patient presents for 3 month follow-up androgenetic alopecia. She has noticed improvement taking Spironolactone '100MG'$  1/2 tablet daily and minoxidil 2.'5MG'$  1/2 tablet daily.   The following portions of the chart were reviewed this encounter and updated as appropriate:       Review of Systems:  No other skin or systemic complaints except as noted in HPI or Assessment and Plan.  Objective  Well appearing patient in no apparent distress; mood and affect are within normal limits.  A focused examination was performed including face, scalp. Relevant physical exam findings are noted in the Assessment and Plan.  Scalp Diffuse thinning of the crown and widening of the midline part with retention of the frontal hairline, stable to slight improvement compared to previous photos - Reviewed progressive nature and prognosis.     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 Condition is symptomatic / bothersome to patient. Not to goal, but improving.   Female Androgenic Alopecia is a chronic condition related to genetics and/or hormonal changes.  In women androgenetic alopecia is commonly associated with menopause but may occur any time after puberty.  It causes hair thinning primarily on the crown with widening of the part and temporal hairline recession.  Can use OTC Rogaine (minoxidil) 5% solution/foam as directed.  Oral treatments in female patients who have no contraindication may include : - Low dose oral minoxidil 1.25 - '5mg'$  daily - Spironolactone 50 - '100mg'$  bid - Finasteride 2.5 - 5 mg daily Adjunctive therapies include: - Low Level Laser Light Therapy (LLLT) - Platelet-rich plasma injections (PRP) - Hair Transplants or scalp reduction  BP 103/73  Increase Minoxidil 2.5 mg tab take 1  pill PO qd.  Increase Spironolactone 100 mg tab take 1 pill PO qd as tolerated, May decrease to 1/2 tablet if symptoms occur   Doses of minoxidil for hair loss are considered 'low dose'. This is because the doses used for hair loss are a lot lower than the doses which are used for conditions such as high blood pressure (hypertension). The doses used for hypertension are 10-'40mg'$  per day.  Side effects are uncommon at the low doses (up to 2.5 mg/day) used to treat hair loss. Potential side effects, more commonly seen at higher doses, include: Increase in hair growth (hypertrichosis) elsewhere on face and body Temporary hair shedding upon starting medication which may last up to 4 weeks Ankle swelling, fluid retention, rapid weight gain more than 5 pounds Low blood pressure and feeling lightheaded or dizzy when standing up quickly Fast or irregular heartbeat Headaches   Spironolactone can cause increased urination and cause blood pressure to decrease. Please watch for signs of lightheadedness and be cautious when changing position. It can sometimes cause breast tenderness or an irregular period in premenopausal women. It can also increase potassium. The increase in potassium usually is not a concern unless you are taking other medicines that also increase potassium, so please be sure your doctor knows all of the other medications you are taking. This medication should not be taken by pregnant women.  This medicine should also not be taken together with sulfa drugs like Bactrim (trimethoprim/sulfamethexazole).        Related Medications minoxidil (LONITEN) 2.5 MG tablet Take 1 tablet (2.5 mg total) by mouth daily.  spironolactone (ALDACTONE) 100  MG tablet Take 1 tablet (100 mg total) by mouth daily.   Return in about 3 months (around 08/13/2022) for alopecia.  IJamesetta Orleans, CMA, am acting as scribe for Brendolyn Patty, MD .  Documentation: I have reviewed the above documentation for accuracy  and completeness, and I agree with the above.  Brendolyn Patty MD

## 2022-05-14 NOTE — Patient Instructions (Addendum)
Minoxidil 2.'5mg'$  - increase to 1 tablet daily.  If tolerating ok, may increase Spironolactone '100mg'$  to 1 tablet daily.    Doses of minoxidil for hair loss are considered 'low dose'. This is because the doses used for hair loss are much lower than the doses which are used for conditions such as high blood pressure (hypertension). The doses used for hypertension are 10-'40mg'$  per day.  Side effects are uncommon at the low doses (up to 2.5 mg/day) used to treat hair loss. Potential side effects, more commonly seen at higher doses, include: Increase in hair growth (hypertrichosis) elsewhere on face and body Temporary hair shedding upon starting medication which may last up to 4 weeks Ankle swelling, fluid retention, rapid weight gain more than 5 pounds Low blood pressure and feeling lightheaded or dizzy when standing up quickly Fast or irregular heartbeat Headaches  Spironolactone can cause increased urination and cause blood pressure to decrease. Please watch for signs of lightheadedness and be cautious when changing position. It can sometimes cause breast tenderness or an irregular period in premenopausal women. It can also increase potassium. The increase in potassium usually is not a concern unless you are taking other medicines that also increase potassium, so please be sure your doctor knows all of the other medications you are taking. This medication should not be taken by pregnant women.  This medicine should also not be taken together with sulfa drugs like Bactrim (trimethoprim/sulfamethexazole).    Due to recent changes in healthcare laws, you may see results of your pathology and/or laboratory studies on MyChart before the doctors have had a chance to review them. We understand that in some cases there may be results that are confusing or concerning to you. Please understand that not all results are received at the same time and often the doctors may need to interpret multiple results in order to  provide you with the best plan of care or course of treatment. Therefore, we ask that you please give [place] 2 business days to thoroughly review all your results before contacting the office for clarification. Should we see a critical lab result, you will be contacted sooner.   If You Need Anything After Your Visit  If you have any questions or concerns for your doctor, please call our main line at [phone] and press option 4 to reach your doctor's medical assistant. If no one answers, please leave a voicemail as directed and we will return your call as soon as possible. Messages left after 4 pm will be answered the following business day.   You may also send [place] a message via [place]. We typically respond to MyChart messages within 1-2 business days.  For prescription refills, please ask your pharmacy to contact our office. Our fax number is [phone].  If you have an urgent issue when the clinic is closed that cannot wait until the next business day, you can page your doctor at the number below.    Please note that while we do our best to be available for urgent issues outside of office hours, we are not available 24/7.   If you have an urgent issue and are unable to reach [place], you may choose to seek medical care at your doctor's office, retail clinic, urgent care center, or emergency room.  If you have a medical emergency, please immediately call 911 or go to the emergency department.  Pager Numbers  - Dr. Nehemiah Massed: [phone]  - Dr. Laurence Ferrari: [phone]  - Dr. Nicole Kindred: [phone]  In the event of inclement weather, please call our main line at [phone] for an update on the status of any delays or closures.  Dermatology Medication Tips: Please keep the boxes that topical medications come in in order to help keep track of the instructions about where and how to use these. Pharmacies typically print the medication instructions only on the boxes and not directly on the  medication tubes.   If your medication is too expensive, please contact our office at [phone] option 4 or send [place] a message through [place].   We are unable to tell what your co-pay for medications will be in advance as this is different depending on your insurance coverage. However, we may be able to find a substitute medication at lower cost or fill out paperwork to get insurance to cover a needed medication.   If a prior authorization is required to get your medication covered by your insurance company, please allow [place] 1-2 business days to complete this process.  Drug prices often vary depending on where the prescription is filled and some pharmacies may offer cheaper prices.  The website www.goodrx.com contains coupons for medications through different pharmacies. The prices here do not account for what the cost may be with help from insurance (it may be cheaper with your insurance), but the website can give you the price if you did not use any insurance.  - You can print the associated coupon and take it with your prescription to the pharmacy.  - You may also stop by our office during regular business hours and pick up a GoodRx coupon card.  - If you need your prescription sent electronically to a different pharmacy, notify our office through [facility] or by phone at [phone] option 4.     Si Usted Necesita Algo Despus de Su Visita  Tambin puede enviarnos un mensaje a travs de Pharmacist, community. Por lo general respondemos a los mensajes de MyChart en el transcurso de 1 a 2 das hbiles.  Para renovar recetas, por favor pida a su farmacia que se ponga en contacto con nuestra oficina. Harland Dingwall de fax es [place] [phone].  Si tiene un asunto urgente cuando la clnica est cerrada y que no puede esperar hasta el siguiente da hbil, puede llamar/localizar a su doctor(a) al nmero que aparece a continuacin.   Por favor, tenga en cuenta que aunque hacemos todo lo posible  para estar disponibles para asuntos urgentes fuera del horario de [place], no estamos disponibles las 24 horas del da, los 7 das de la [place].   Si tiene un problema urgente y no puede comunicarse con nosotros, puede optar por buscar atencin mdica  en el consultorio de su doctor(a), en una clnica privada, en un centro de atencin urgente o en una sala de emergencias.  Si tiene Engineering geologist, por favor llame inmediatamente al 911 o vaya a la sala de emergencias.  Nmeros de bper  - Dr. Nehemiah Massed: [phone]  - Dra. Moye: [phone]  - Dra. Nicole Kindred: [phone]  En caso de inclemencias del [place], por favor llame a Johnsie Kindred principal al [phone] para una actualizacin sobre el [place] de cualquier retraso o cierre.  Consejos para la medicacin en dermatologa: Por favor, guarde las cajas en las que vienen los medicamentos de uso tpico para ayudarle a seguir las instrucciones sobre dnde y cmo usarlos. Las farmacias generalmente imprimen las instrucciones del medicamento slo en las cajas y no directamente en los tubos del [place].  Si su medicamento es muy caro, por favor, pngase en contacto con Zigmund Daniel llamando al [phone] y presione la opcin 4 o envenos un mensaje a travs de Pharmacist, community.   No podemos decirle cul ser su copago por los medicamentos por adelantado ya que esto es diferente dependiendo de la cobertura de su seguro. Sin embargo, es posible que podamos encontrar un medicamento sustituto a Electrical engineer un formulario para que el seguro cubra el medicamento que se considera necesario.   Si se requiere una autorizacin previa para que su compaa de seguros [place] su medicamento, por favor permtanos de 1 a 2 das hbiles para completar este proceso.  Los precios de los medicamentos varan con frecuencia dependiendo del Environmental consultant de dnde se surte la receta y alguna farmacias pueden ofrecer precios ms baratos.  El sitio web  www.goodrx.com tiene cupones para medicamentos de Airline pilot. Los precios aqu no tienen en cuenta lo que podra costar con la ayuda del seguro (puede ser ms barato con su seguro), pero el sitio web puede darle el precio si no utiliz Research scientist (physical sciences).  - Puede imprimir el cupn correspondiente y llevarlo con su receta a la farmacia.  - Tambin puede pasar por nuestra oficina durante el horario de atencin regular y Charity fundraiser una tarjeta de cupones de GoodRx.  - Si necesita que su receta se enve electrnicamente a una farmacia diferente, informe a nuestra oficina a travs de MyChart de  o por telfono llamando al [phone] y presione la opcin 4.

## 2022-08-02 ENCOUNTER — Other Ambulatory Visit: Payer: Self-pay | Admitting: Dermatology

## 2022-08-02 DIAGNOSIS — L649 Androgenic alopecia, unspecified: Secondary | ICD-10-CM

## 2022-08-05 ENCOUNTER — Encounter: Payer: 59 | Admitting: Internal Medicine

## 2022-08-13 ENCOUNTER — Ambulatory Visit: Payer: 59 | Admitting: Internal Medicine

## 2022-08-13 ENCOUNTER — Ambulatory Visit: Payer: 59 | Admitting: Dermatology

## 2022-08-28 ENCOUNTER — Ambulatory Visit: Payer: 59 | Admitting: Dermatology

## 2022-08-29 ENCOUNTER — Other Ambulatory Visit: Payer: Self-pay | Admitting: Internal Medicine

## 2022-08-29 NOTE — Telephone Encounter (Signed)
Courtesy refill given, appointment needed.   Requested Prescriptions  Pending Prescriptions Disp Refills   LINZESS 72 MCG capsule [Pharmacy Med Name: LINZESS 72 MCG CAPSULE] 30 capsule 0    Sig: TAKE 1 CAPSULE BY MOUTH DAILY BEFORE BREAKFAST.     Gastroenterology: Irritable Bowel Syndrome Passed - 08/29/2022  4:50 AM      Passed - Valid encounter within last 12 months    Recent Outpatient Visits           4 months ago Missed menses   [place] [facility] [place], Salvadore Oxford, NP   6 months ago Chronic constipation   [place] [facility] [place], Salvadore Oxford, NP       Future Appointments             In 6 days Willeen Niece, MD [facility]

## 2022-09-04 ENCOUNTER — Telehealth: Payer: 59 | Admitting: Dermatology

## 2022-09-04 DIAGNOSIS — L649 Androgenic alopecia, unspecified: Secondary | ICD-10-CM | POA: Diagnosis not present

## 2022-09-04 DIAGNOSIS — Z79899 Other long term (current) drug therapy: Secondary | ICD-10-CM | POA: Diagnosis not present

## 2022-09-04 DIAGNOSIS — Z7189 Other specified counseling: Secondary | ICD-10-CM

## 2022-09-04 NOTE — Patient Instructions (Signed)
Due to recent changes in healthcare laws, you may see results of your pathology and/or laboratory studies on MyChart before the doctors have had a chance to review them. We understand that in some cases there may be results that are confusing or concerning to you. Please understand that not all results are received at the same time and often the doctors may need to interpret multiple results in order to provide you with the best plan of care or course of treatment. Therefore, we ask that you please give us 2 business days to thoroughly review all your results before contacting the office for clarification. Should we see a critical lab result, you will be contacted sooner.   If You Need Anything After Your Visit  If you have any questions or concerns for your doctor, please call our main line at [phone] and press option 4 to reach your doctor's medical assistant. If no one answers, please leave a voicemail as directed and we will return your call as soon as possible. Messages left after 4 pm will be answered the following business day.   You may also send us a message via MyChart. We typically respond to MyChart messages within 1-2 business days.  For prescription refills, please ask your pharmacy to contact our office. Our fax number is [phone].  If you have an urgent issue when the clinic is closed that cannot wait until the next business day, you can page your doctor at the number below.    Please note that while we do our best to be available for urgent issues outside of office hours, we are not available 24/7.   If you have an urgent issue and are unable to reach us, you may choose to seek medical care at your doctor's office, retail clinic, urgent care center, or emergency room.  If you have a medical emergency, please immediately call 911 or go to the emergency department.  Pager Numbers  - Dr. Kowalski: [phone]  - Dr. Moye: [phone]  - Dr. Stewart:  [phone]  In the event of inclement weather, please call our main line at [phone] for an update on the status of any delays or closures.  Dermatology Medication Tips: Please keep the boxes that topical medications come in in order to help keep track of the instructions about where and how to use these. Pharmacies typically print the medication instructions only on the boxes and not directly on the medication tubes.   If your medication is too expensive, please contact our office at [phone] option 4 or send us a message through MyChart.   We are unable to tell what your co-pay for medications will be in advance as this is different depending on your insurance coverage. However, we may be able to find a substitute medication at lower cost or fill out paperwork to get insurance to cover a needed medication.   If a prior authorization is required to get your medication covered by your insurance company, please allow us 1-2 business days to complete this process.  Drug prices often vary depending on where the prescription is filled and some pharmacies may offer cheaper prices.  The website www.goodrx.com contains coupons for medications through different pharmacies. The prices here do not account for what the cost may be with help from insurance (it may be cheaper with your insurance), but the website can give you the price if you did not use any insurance.  - You can print the associated coupon and take it with   your prescription to the pharmacy.  - You may also stop by our office during regular business hours and pick up a GoodRx coupon card.  - If you need your prescription sent electronically to a different pharmacy, notify our office through [place] MyChart or by phone at [phone] option 4.     Si Usted Necesita Algo Despus de Su Visita  Tambin puede enviarnos un mensaje a travs de MyChart. Por lo general respondemos a los mensajes de MyChart en el transcurso de 1 a 2  das hbiles.  Para renovar recetas, por favor pida a su farmacia que se ponga en contacto con nuestra oficina. Nuestro nmero de fax es el [phone].  Si tiene un asunto urgente cuando la clnica est cerrada y que no puede esperar hasta el siguiente da hbil, puede llamar/localizar a su doctor(a) al nmero que aparece a continuacin.   Por favor, tenga en cuenta que aunque hacemos todo lo posible para estar disponibles para asuntos urgentes fuera del horario de oficina, no estamos disponibles las 24 horas del da, los 7 das de la semana.   Si tiene un problema urgente y no puede comunicarse con nosotros, puede optar por buscar atencin mdica  en el consultorio de su doctor(a), en una clnica privada, en un centro de atencin urgente o en una sala de emergencias.  Si tiene una emergencia mdica, por favor llame inmediatamente al 911 o vaya a la sala de emergencias.  Nmeros de bper  - Dr. Kowalski: [phone]  - Dra. Moye: [phone]  - Dra. Stewart: [phone]  En caso de inclemencias del tiempo, por favor llame a nuestra lnea principal al [phone] para una actualizacin sobre el estado de cualquier retraso o cierre.  Consejos para la medicacin en dermatologa: Por favor, guarde las cajas en las que vienen los medicamentos de uso tpico para ayudarle a seguir las instrucciones sobre dnde y cmo usarlos. Las farmacias generalmente imprimen las instrucciones del medicamento slo en las cajas y no directamente en los tubos del medicamento.   Si su medicamento es muy caro, por favor, pngase en contacto con nuestra oficina llamando al [phone] y presione la opcin 4 o envenos un mensaje a travs de MyChart.   No podemos decirle cul ser su copago por los medicamentos por adelantado ya que esto es diferente dependiendo de la cobertura de su seguro. Sin embargo, es posible que podamos encontrar un medicamento sustituto a menor costo o llenar un formulario para que el  seguro cubra el medicamento que se considera necesario.   Si se requiere una autorizacin previa para que su compaa de seguros cubra su medicamento, por favor permtanos de 1 a 2 das hbiles para completar este proceso.  Los precios de los medicamentos varan con frecuencia dependiendo del lugar de dnde se surte la receta y alguna farmacias pueden ofrecer precios ms baratos.  El sitio web www.goodrx.com tiene cupones para medicamentos de diferentes farmacias. Los precios aqu no tienen en cuenta lo que podra costar con la ayuda del seguro (puede ser ms barato con su seguro), pero el sitio web puede darle el precio si no utiliz ningn seguro.  - Puede imprimir el cupn correspondiente y llevarlo con su receta a la farmacia.  - Tambin puede pasar por nuestra oficina durante el horario de atencin regular y recoger una tarjeta de cupones de GoodRx.  - Si necesita que su receta se enve electrnicamente a una farmacia diferente, informe a nuestra oficina a travs de MyChart de [place]   o por telfono llamando al [phone] y presione la opcin 4.  

## 2022-09-04 NOTE — Progress Notes (Signed)
Virtual Visit via Video Note   I connected with Hansel Feinstein on Sep 03, 2021 at  12:50 PM  EDT by a video enabled telemedicine application and verified that I am speaking with the correct person using two identifiers.   Location: Patient: Caitlin Castillo in [place], [place] Provider:Kyiah Canepa Roseanne Reno, MD in [place], [place] Length of Video Visit: 13 minutes 25 seconds  Patient is aware that insurance will be billed for this appointment.  [facility] is located in [place], [place]   I discussed the limitations of evaluation and management by telemedicine and the availability of in person appointments. The patient expressed understanding and agreed to proceed.   I discussed the assessment and treatment plan with the patient. The patient was provided an opportunity to ask questions and all were answered. The patient agreed with the plan and demonstrated an understanding of the instructions.   The patient was advised to call back or seek an in-person evaluation if the symptoms worsen or if the condition fails to improve as anticipated.  Subjective: Patient with androgenetic alopecia, 3 month follow-up. She is taking minoxidil 2.5 MG 1 full tablet daily, and Spironolactone 100 MG 1/2 tablet daily. She is tolerating meds ok, but has had increased hair growth on the face.     The following portions of the chart were reviewed this encounter and updated as appropriate: medications, allergies, medical history  Review of Systems:  No other skin or systemic complaints except as noted in HPI or Assessment and Plan.  Objective  Well appearing patient in no apparent distress; mood and affect are within normal limits.  A focused examination was performed of the following areas: face  Relevant exam findings are noted in the Assessment and Plan.    Assessment & Plan   ANDROGENETIC ALOPECIA (FEMALE PATTERN HAIR LOSS) Exam: Good hair regrowth on scalp per patient.   Chronic condition with  duration or expected duration over one year. Currently improving but having increase hair growth on face.  Female Androgenic Alopecia is a chronic condition related to genetics and/or hormonal changes.  In women androgenetic alopecia is commonly associated with menopause but may occur any time after puberty.  It causes hair thinning primarily on the crown with widening of the part and temporal hairline recession.  Can use OTC Rogaine (minoxidil) 5% solution/foam as directed.  Oral treatments in female patients who have no contraindication may include : - Low dose oral minoxidil 1.25 - 5mg  daily - Spironolactone 50 - 100mg  bid - Finasteride 2.5 - 5 mg daily Adjunctive therapies include: - Low Level Laser Light Therapy (LLLT) - Platelet-rich plasma injections (PRP) - Hair Transplants or scalp reduction   Treatment Plan: Decrease minoxidil 2.5 MG take 1/2 tablet daily. Pt has at home.   Doses of minoxidil for hair loss are considered 'low dose'. This is because the doses used for hair loss are much lower than the doses which are used for conditions such as high blood pressure (hypertension). The doses used for hypertension are 10-40mg  per day.  Side effects are uncommon at the low doses (up to 2.5 mg/day) used to treat hair loss. Potential side effects, more commonly seen at higher doses, include: Increase in hair growth (hypertrichosis) elsewhere on face and body Temporary hair shedding upon starting medication which may last up to 4 weeks Ankle swelling, fluid retention, rapid weight gain more than 5 pounds Low blood pressure and feeling lightheaded or dizzy when standing up quickly Fast or irregular heartbeat Headaches  Increase Spironolactone 100 MG take 1 po QD. Pt has at home.   Return in about 5 months (around 02/04/2023) for TBSE, Alopecia.  ICherlyn Labella, CMA, am acting as scribe for Willeen Niece, MD .   Documentation: I have reviewed the above documentation for accuracy and  completeness, and I agree with the above.  Willeen Niece, MD

## 2022-09-05 ENCOUNTER — Ambulatory Visit (INDEPENDENT_AMBULATORY_CARE_PROVIDER_SITE_OTHER): Payer: 59 | Admitting: Internal Medicine

## 2022-09-05 ENCOUNTER — Encounter: Payer: Self-pay | Admitting: Internal Medicine

## 2022-09-05 VITALS — BP 114/66 | HR 72 | Temp 96.4°F | Ht 68.0 in | Wt 175.0 lb

## 2022-09-05 DIAGNOSIS — R1013 Epigastric pain: Secondary | ICD-10-CM | POA: Diagnosis not present

## 2022-09-05 DIAGNOSIS — Z1231 Encounter for screening mammogram for malignant neoplasm of breast: Secondary | ICD-10-CM

## 2022-09-05 DIAGNOSIS — R7309 Other abnormal glucose: Secondary | ICD-10-CM

## 2022-09-05 DIAGNOSIS — E663 Overweight: Secondary | ICD-10-CM

## 2022-09-05 DIAGNOSIS — Z1211 Encounter for screening for malignant neoplasm of colon: Secondary | ICD-10-CM

## 2022-09-05 DIAGNOSIS — Z0001 Encounter for general adult medical examination with abnormal findings: Secondary | ICD-10-CM

## 2022-09-05 DIAGNOSIS — Z6826 Body mass index (BMI) 26.0-26.9, adult: Secondary | ICD-10-CM

## 2022-09-05 NOTE — Patient Instructions (Signed)

## 2022-09-05 NOTE — Assessment & Plan Note (Signed)
Encouraged diet and exercise for weight loss ?

## 2022-09-05 NOTE — Progress Notes (Signed)
Subjective:    Patient ID: Caitlin Castillo, female    DOB: January 21, 1976, 47 y.o.   MRN: [id number]  HPI  Patient presents to clinic today for her annual exam.  Flu: 01/2022 Tetanus: < 5 years  COVID: Never Pap smear: 11/2019 Mammogram: 06/2021 Vision screening: annually Dentist: biannually  Diet: She does eat meat. She consumes fruits and veggies. She tries to avoid fried foods. She drinks mostly water or coffee. Exercise: Walking  Review of Systems  Past Medical History:  Diagnosis Date   Allergy    Anxiety    mild   Chronic constipation    Endometriosis    Fibrocystic breast    Ovarian cyst    Pelvic pain in female    Right lower quadrant pain     Current Outpatient Medications  Medication Sig Dispense Refill   linaclotide (LINZESS) 72 MCG capsule Take 1 capsule (72 mcg total) by mouth daily before breakfast. OFFICE VISIT NEEDED FOR ADDITIONAL REFILLS 30 capsule 0   minoxidil (LONITEN) 2.5 MG tablet TAKE 1 TABLET BY MOUTH EVERY DAY 30 tablet 3   selenium sulfide (SELSUN) 2.5 % shampoo Apply topically to aa leave on overnight and wash off next morning 3 x weekly. Can use qd prn 118 mL 5   spironolactone (ALDACTONE) 100 MG tablet TAKE 1 TABLET BY MOUTH EVERY DAY 30 tablet 3   No current facility-administered medications for this visit.    Allergies  Allergen Reactions   Citrus    Shellfish Allergy    Tape    Metrogel [Metronidazole] Rash    ??    Tylenol With Codeine #3 [Acetaminophen-Codeine] Rash    Family History  Problem Relation Age of Onset   Prostate cancer Maternal Grandfather    Brain cancer Maternal Grandfather    Stroke Maternal Grandfather    Endometriosis Mother    Drug abuse Maternal Aunt    Drug abuse Maternal Uncle    Colon cancer Neg Hx    Ovarian cancer Neg Hx    Cancer Neg Hx    Diabetes Neg Hx    Heart disease Neg Hx    Breast cancer Neg Hx     Social History   Socioeconomic History   Marital status: Married    Spouse name:  Not on file   Number of children: Not on file   Years of education: Not on file   Highest education level: Not on file  Occupational History   Not on file  Tobacco Use   Smoking status: Never   Smokeless tobacco: Never  Vaping Use   Vaping Use: Never used  Substance and Sexual Activity   Alcohol use: Yes    Alcohol/week: 0.0 standard drinks of alcohol    Comment: occasional   Drug use: No   Sexual activity: Yes    Birth control/protection: Surgical    Comment: vasctomy  Other Topics Concern   Not on file  Social History Narrative   ** Merged History Encounter **       Social Determinants of Health   Financial Resource Strain: Not on file  Food Insecurity: Not on file  Transportation Needs: Not on file  Physical Activity: Insufficiently Active (07/07/2017)   Exercise Vital Sign    Days of Exercise per Week: 2 days    Minutes of Exercise per Session: 30 min  Stress: Not on file  Social Connections: Not on file  Intimate Partner Violence: Not on file     Constitutional: Denies  fever, malaise, fatigue, headache or abrupt weight changes.  HEENT: Denies eye pain, eye redness, ear pain, ringing in the ears, wax buildup, runny nose, nasal congestion, bloody nose, or sore throat. Respiratory: Denies difficulty breathing, shortness of breath, cough or sputum production.   Cardiovascular: Pt reports palpitations. Denies chest pain, chest tightness, or swelling in the hands or feet.  Gastrointestinal: Patient reports constipation.  Denies abdominal pain, bloating, diarrhea or blood in the stool.  GU: Denies urgency, frequency, pain with urination, burning sensation, blood in urine, odor or discharge. Musculoskeletal: Denies decrease in range of motion, difficulty with gait, muscle pain or joint pain and swelling.  Skin: Denies redness, rashes, lesions or ulcercations.  Neurological: Denies dizziness, difficulty with memory, difficulty with speech or problems with balance and  coordination.  Psych: Denies anxiety, depression, SI/HI.  No other specific complaints in a complete review of systems (except as listed in HPI above).     Objective:   Physical Exam  BP 114/66 (BP Location: Left Arm, Patient Position: Sitting, Cuff Size: Normal)   Pulse 72   Temp (!) 96.4 F (35.8 C) (Temporal)   Ht 5\' 8"  (1.727 m)   Wt 175 lb (79.4 kg)   SpO2 98%   BMI 26.61 kg/m   Wt Readings from Last 3 Encounters:  04/04/22 178 lb (80.7 kg)  02/06/22 168 lb (76.2 kg)  07/17/21 170 lb (77.1 kg)    General: Appears her stated age, overweight in NAD. Skin: Warm, dry and intact. No rashes, lesions or ulcerations noted. HEENT: Head: normal shape and size; Eyes: sclera white, no icterus, conjunctiva pink, PERRLA and EOMs intact;  Neck:  Neck supple, trachea midline. No masses, lumps or thyromegaly present.  Cardiovascular: Normal rate and rhythm. S1,S2 noted.  No murmur, rubs or gallops noted. No JVD or BLE edema. Pulmonary/Chest: Normal effort and positive vesicular breath sounds. No respiratory distress. No wheezes, rales or ronchi noted.  Abdomen: Soft and nontender. Normal bowel sounds.  Musculoskeletal: Strength 5/5 BUE/BLE.  No difficulty with gait.  Neurological: Alert and oriented. Cranial nerves II-XII grossly intact. Coordination normal.  Psychiatric: Mood and affect normal. Behavior is normal. Judgment and thought content normal.    BMET    Component Value Date/Time   NA 136 07/18/2021 1119   K 4.8 07/18/2021 1119   CL 102 07/18/2021 1119   CO2 21 07/18/2021 1119   GLUCOSE 92 07/18/2021 1119   GLUCOSE 97 01/01/2018 1019   BUN 12 07/18/2021 1119   CREATININE 0.93 07/18/2021 1119   CALCIUM 9.0 07/18/2021 1119    Lipid Panel     Component Value Date/Time   CHOL 146 07/18/2021 1119   TRIG 47 07/18/2021 1119   HDL 67 07/18/2021 1119   CHOLHDL 2.2 07/18/2021 1119   LDLCALC 68 07/18/2021 1119    CBC    Component Value Date/Time   WBC 5.4 07/18/2021  1119   WBC 11.9 (H) 02/14/2012 0359   RBC 4.70 07/18/2021 1119   RBC 4.29 02/14/2012 0359   HGB 14.2 07/18/2021 1119   HCT 41.6 07/18/2021 1119   PLT 280 07/18/2021 1119   MCV 89 07/18/2021 1119   MCV 82 02/14/2012 0359   MCH 30.2 07/18/2021 1119   MCH 27.2 02/14/2012 0359   MCHC 34.1 07/18/2021 1119   MCHC 33.2 02/14/2012 0359   RDW 12.2 07/18/2021 1119   RDW 13.9 02/14/2012 0359   LYMPHSABS 2.3 02/14/2012 0359   MONOABS 0.6 02/14/2012 0359   EOSABS 0.0 02/14/2012  0359   BASOSABS 0.0 02/14/2012 0359    Hgb A1C Lab Results  Component Value Date   HGBA1C 5.2 07/18/2021           Assessment & Plan:   Preventative Health Maintenance:  Encouraged her to get a flu shot in the fall Tetanus UTD per her report Encouraged her to get her COVID-vaccine Pap smear UTD Mammogram ordered-she will call to schedule Referral to GI for screening colonoscopy Encouraged her to consume a balanced diet and exercise regimen Advised her to see an eye doctor and dentist annually We will check CBC, c-Met, lipid, A1c  RTC in 6 months, follow-up chronic conditions Nicki Reaper, NP

## 2022-09-06 ENCOUNTER — Telehealth: Payer: Self-pay

## 2022-09-06 LAB — CBC
HCT: 45.4 % — ABNORMAL HIGH (ref 35.0–45.0)
Hemoglobin: 15.1 g/dL (ref 11.7–15.5)
MCH: 30.2 pg (ref 27.0–33.0)
MCHC: 33.3 g/dL (ref 32.0–36.0)
MCV: 90.8 fL (ref 80.0–100.0)
MPV: 9.4 fL (ref 7.5–12.5)
Platelets: 276 10*3/uL (ref 140–400)
RBC: 5 10*6/uL (ref 3.80–5.10)
RDW: 12.5 % (ref 11.0–15.0)
WBC: 5.8 10*3/uL (ref 3.8–10.8)

## 2022-09-06 LAB — COMPLETE METABOLIC PANEL WITH GFR
AG Ratio: 1.8 (calc) (ref 1.0–2.5)
ALT: 14 U/L (ref 6–29)
AST: 21 U/L (ref 10–30)
Albumin: 4.4 g/dL (ref 3.6–5.1)
Alkaline phosphatase (APISO): 37 U/L (ref 31–125)
BUN: 16 mg/dL (ref 7–25)
CO2: 25 mmol/L (ref 20–32)
Calcium: 9.5 mg/dL (ref 8.6–10.2)
Chloride: 104 mmol/L (ref 98–110)
Creat: 0.8 mg/dL (ref 0.50–0.99)
Globulin: 2.5 g/dL (calc) (ref 1.9–3.7)
Glucose, Bld: 83 mg/dL (ref 65–99)
Potassium: 4.2 mmol/L (ref 3.5–5.3)
Sodium: 137 mmol/L (ref 135–146)
Total Bilirubin: 0.6 mg/dL (ref 0.2–1.2)
Total Protein: 6.9 g/dL (ref 6.1–8.1)
eGFR: 93 mL/min/{1.73_m2} (ref >=60)

## 2022-09-06 LAB — LIPID PANEL
Cholesterol: 148 mg/dL (ref <200)
HDL: 71 mg/dL (ref >=50)
LDL Cholesterol (Calc): 62 mg/dL (calc)
Non-HDL Cholesterol (Calc): 77 mg/dL (calc) (ref <130)
Total CHOL/HDL Ratio: 2.1 (calc) (ref <5.0)
Triglycerides: 69 mg/dL (ref <150)

## 2022-09-06 LAB — HEMOGLOBIN A1C
Hgb A1c MFr Bld: 5.3 % of total Hgb (ref <5.7)
Mean Plasma Glucose: 105 mg/dL
eAG (mmol/L): 5.8 mmol/L

## 2022-09-06 LAB — CELIAC DISEASE PANEL
(tTG) Ab, IgA: <1 U/mL
(tTG) Ab, IgG: <1 U/mL
Gliadin IgA: <1 U/mL
Gliadin IgG: <1 U/mL
Immunoglobulin A: 160 mg/dL (ref 47–310)

## 2022-09-06 NOTE — Telephone Encounter (Signed)
Gastroenterology Pre-Procedure Review  Request Date: TBD Requesting Physician: Dr. Jodelle Gross  PATIENT REVIEW QUESTIONS: The patient responded to the following health history questions as indicated:    1. Are you having any GI issues? yes (Chronic constipation despite taking linzess and indigestion.  Patients 1st colonoscopy however she is not 47) 2. Do you have a personal history of Polyps? no 3. Do you have a family history of Colon Cancer or Polyps? no 4. Diabetes Mellitus? no 5. Joint replacements in the past 12 months?no 6. Major health problems in the past 3 months?no 7. Any artificial heart valves, MVP, or defibrillator?no    MEDICATIONS & ALLERGIES:    Patient reports the following regarding taking any anticoagulation/antiplatelet therapy:   Plavix, Coumadin, Eliquis, Xarelto, Lovenox, Pradaxa, Brilinta, or Effient? no Aspirin? no  Patient confirms/reports the following medications:  Current Outpatient Medications  Medication Sig Dispense Refill   linaclotide (LINZESS) 72 MCG capsule Take 1 capsule (72 mcg total) by mouth daily before breakfast. OFFICE VISIT NEEDED FOR ADDITIONAL REFILLS 30 capsule 0   minoxidil (LONITEN) 2.5 MG tablet TAKE 1 TABLET BY MOUTH EVERY DAY 30 tablet 3   spironolactone (ALDACTONE) 100 MG tablet TAKE 1 TABLET BY MOUTH EVERY DAY 30 tablet 3   No current facility-administered medications for this visit.    Patient confirms/reports the following allergies:  Allergies  Allergen Reactions   Citrus    Shellfish Allergy    Tape    Metrogel [Metronidazole] Rash    ??    Tylenol With Codeine #3 [Acetaminophen-Codeine] Rash    No orders of the defined types were placed in this encounter.   AUTHORIZATION INFORMATION Primary Insurance: 1D#: Group #:  Secondary Insurance: 1D#: Group #:  SCHEDULE INFORMATION: Date: TBD Time: Location: TBD

## 2022-09-18 NOTE — Progress Notes (Signed)
Celso Amy, PA-C [address]  [address]  [place], [place] [address]  Main: [phone]  Fax: [phone]   Gastroenterology Consultation  Referring Provider:     Lorre Munroe, NP Primary Care Physician:  Lorre Munroe, NP Primary Gastroenterologist:  Celso Amy, PA-C / Dr. Wyline Mood  Reason for Consultation:     Constipation, indigestion        HPI:   Caitlin Castillo is a 47 y.o. y/o female referred for consultation & management  by Lorre Munroe, NP.    She has had chronic heartburn, acid reflux, and constipation for many years.  She has intermittent episodes of epigastric pain after eating with increased belching for several years.  She has not had much heartburn.  Denies dysphagia or unintentional weight loss.  Takes Gaviscon as needed which helps.  Is not on a PPI or H2 RB.  No previous EGD.  Still has gallbladder.  Denies RUQ pain, nausea, or vomiting.  Has chronic constipation for many years.  Bowel movement once per week with hard stools and straining.  Was recently started on Linzess 72 mcg once daily which helps, yet she still has mild constipation.  Currently having bowel movement every 2 or 3 days.  She denies rectal bleeding, weight loss, or anemia.  She has chronic intermittent right lower quadrant pain for several years.  Worse when she is constipated.  Negative pelvic ultrasound and CT scan in the past.  No previous colonoscopy or GI evaluation.    Past Medical History:  Diagnosis Date   Allergy    Anxiety    mild   Chronic constipation    Endometriosis    Fibrocystic breast    Ovarian cyst    Pelvic pain in female    Right lower quadrant pain     Past Surgical History:  Procedure Laterality Date   BREAST BIOPSY Left 05/13/2019   X-clip, stereo bx, BENIGN MAMMARY PARENCHYMA WITH FIBROCYSTIC AND FIBROADENOMATOID   BREAST BIOPSY Right 05/13/2019   stereo bx, CSL, ribbon clip surgical removed   BREAST LUMPECTOMY WITH RADIOACTIVE SEED  LOCALIZATION Right 07/23/2019   Procedure: RIGHT BREAST LUMPECTOMY WITH RADIOACTIVE SEED LOCALIZATION;  Surgeon: Griselda Miner, MD;  [facility];  Service: General;  Laterality: Right;   BREAST SURGERY Right 07/23/2019   rt breast CSL removed   TONSILLECTOMY AND ADENOIDECTOMY     WISDOM TOOTH EXTRACTION      Prior to Admission medications   Medication Sig Start Date End Date Taking? Authorizing Provider  linaclotide (LINZESS) 72 MCG capsule Take 1 capsule (72 mcg total) by mouth daily before breakfast. OFFICE VISIT NEEDED FOR ADDITIONAL REFILLS 08/29/22   Lorre Munroe, NP  minoxidil (LONITEN) 2.5 MG tablet TAKE 1 TABLET BY MOUTH EVERY DAY 08/05/22   Willeen Niece, MD  spironolactone (ALDACTONE) 100 MG tablet TAKE 1 TABLET BY MOUTH EVERY DAY 08/05/22   Willeen Niece, MD    Family History  Problem Relation Age of Onset   Endometriosis Mother    Prostate cancer Maternal Grandfather    Brain cancer Maternal Grandfather    Stroke Maternal Grandfather    Drug abuse Maternal Aunt    Drug abuse Maternal Uncle    Colon cancer Neg Hx    Ovarian cancer Neg Hx    Cancer Neg Hx    Diabetes Neg Hx    Heart disease Neg Hx    Breast cancer Neg Hx  Social History   Tobacco Use   Smoking status: Never   Smokeless tobacco: Never  Vaping Use   Vaping Use: Never used  Substance Use Topics   Alcohol use: Yes    Alcohol/week: 0.0 standard drinks of alcohol    Comment: occasional   Drug use: No    Allergies as of 09/19/2022 - Review Complete 09/06/2022  Allergen Reaction Noted   Citrus  11/12/2011   Shellfish allergy  11/12/2011   Tape  11/12/2011   Metrogel [metronidazole] Rash 03/20/2015   Tylenol with codeine #3 [acetaminophen-codeine] Rash 07/07/2017    Review of Systems:    All systems reviewed and negative except where noted in HPI.   Physical Exam:  There were no vitals taken for this visit. No LMP recorded. Psych:  Alert and cooperative. Normal  mood and affect. General:   Alert,  Well-developed, well-nourished, pleasant and cooperative in NAD Head:  Normocephalic and atraumatic. Eyes:  Sclera clear, no icterus.   Conjunctiva pink. Neck:  Supple; no masses or thyromegaly. Lungs:  Respirations even and unlabored.  Clear throughout to auscultation.   No wheezes, crackles, or rhonchi. No acute distress. Heart:  Regular rate and rhythm; no murmurs, clicks, rubs, or gallops. Abdomen:  Normal bowel sounds.  No bruits.  Soft, and non-distended without masses, hepatosplenomegaly or hernias noted.  No Tenderness.  No guarding or rebound tenderness.    Neurologic:  Alert and oriented x3;  grossly normal neurologically. Psych:  Alert and cooperative. Normal mood and affect.  Imaging Studies: No results found.  Assessment and Plan:   Caitlin Castillo is a 47 y.o. y/o female has been referred for   Chronic Constipation - Improved, yet not controlled.  Try Higher Dose of Linzess (145 or 290). Gave samples of Linzess 145 mcg QD for 1 week, then 290 mcg QD for 1 week.  She will let me know which dose works best, and then she can call me back for a prescription.  Discussed constipation treatment at length. Recommend High Fiber diet with fruits, vegetables, and whole grains. Drink 64 ounces of Fluids Daily.  GERD  Rx Pantoprazole 40mg  daily. Recommend Lifestyle Modifications to prevent Acid Reflux.  Rec. Avoid coffee, sodas, peppermint, citrus fruits, and spicey foods.  Avoid eating 2-3 hours before bedtime.   Epigastric pain  Scheduling EGD I discussed risks of EGD with patient to include risk of bleeding, perforation, and risk of sedation.   Patient expressed understanding and agrees to proceed with EGD.   Change in bowel habits Scheduling Colonoscopy I discussed risks of colonoscopy with patient to include risk of bleeding, colon perforation, and risk of sedation.  Patient expressed understanding and agrees to proceed with colonoscopy.    RLQ pain - Negative Pelvic US and CT.  Treat Constipation.  Colon cancer screening  Scheduling Colonoscopy    Follow up in 4 weeks after EGD and colonoscopy procedure with TG.  Celso Amy, PA-C

## 2022-09-19 ENCOUNTER — Encounter: Payer: Self-pay | Admitting: Physician Assistant

## 2022-09-19 ENCOUNTER — Other Ambulatory Visit: Payer: Self-pay

## 2022-09-19 ENCOUNTER — Ambulatory Visit: Payer: 59 | Admitting: Physician Assistant

## 2022-09-19 VITALS — BP 103/70 | HR 79 | Temp 97.9°F | Ht 68.0 in | Wt 174.0 lb

## 2022-09-19 DIAGNOSIS — K219 Gastro-esophageal reflux disease without esophagitis: Secondary | ICD-10-CM

## 2022-09-19 DIAGNOSIS — R1013 Epigastric pain: Secondary | ICD-10-CM | POA: Diagnosis not present

## 2022-09-19 DIAGNOSIS — K5909 Other constipation: Secondary | ICD-10-CM

## 2022-09-19 DIAGNOSIS — R194 Change in bowel habit: Secondary | ICD-10-CM

## 2022-09-19 DIAGNOSIS — R1031 Right lower quadrant pain: Secondary | ICD-10-CM | POA: Diagnosis not present

## 2022-09-19 DIAGNOSIS — Z1211 Encounter for screening for malignant neoplasm of colon: Secondary | ICD-10-CM

## 2022-09-19 MED ORDER — PANTOPRAZOLE SODIUM 40 MG PO TBEC
40.0000 mg | DELAYED_RELEASE_TABLET | Freq: Every day | ORAL | 3 refills | Status: DC
Start: 2022-09-19 — End: 2024-03-11

## 2022-09-19 MED ORDER — PEG 3350-KCL-NA BICARB-NACL 420 G PO SOLR: 4000.0000 mL | Freq: Once | ORAL | 0 refills | Status: AC

## 2022-09-19 NOTE — Patient Instructions (Signed)
Constipation, Adult Constipation is when a person has fewer than three bowel movements in a week, has difficulty having a bowel movement, or has stools (feces) that are dry, hard, or larger than normal. Constipation may be caused by an underlying condition. It may become worse with age if a person takes certain medicines and does not take in enough fluids. Follow these instructions at home: Eating and drinking  Eat foods that have a lot of fiber, such as beans, whole grains, and fresh fruits and vegetables. Limit foods that are low in fiber and high in fat and processed sugars, such as fried or sweet foods. These include french fries, hamburgers, cookies, candies, and soda. Drink enough fluid to keep your urine pale yellow. General instructions Exercise regularly or as told by your health care provider. Try to do 150 minutes of moderate exercise each week. Use the bathroom when you have the urge to go. Do not hold it in. Take over-the-counter and prescription medicines only as told by your health care provider. This includes any fiber supplements. During bowel movements: Practice deep breathing while relaxing the lower abdomen. Practice pelvic floor relaxation. Watch your condition for any changes. Let your health care provider know about them. Keep all follow-up visits as told by your health care provider. This is important. Contact a health care provider if: You have pain that gets worse. You have a fever. You do not have a bowel movement after 4 days. You vomit. You are not hungry or you lose weight. You are bleeding from the opening between the buttocks (anus). You have thin, pencil-like stools. Get help right away if: You have a fever and your symptoms suddenly get worse. You leak stool or have blood in your stool. Your abdomen is bloated. You have severe pain in your abdomen. You feel dizzy or you faint. Summary Constipation is when a person has fewer than three bowel movements  in a week, has difficulty having a bowel movement, or has stools (feces) that are dry, hard, or larger than normal. Eat foods that have a lot of fiber, such as beans, whole grains, and fresh fruits and vegetables. Drink enough fluid to keep your urine pale yellow. Take over-the-counter and prescription medicines only as told by your health care provider. This includes any fiber supplements. This information is not intended to replace advice given to you by your health care provider. Make sure you discuss any questions you have with your health care provider. Document Revised: 02/27/2022 Document Reviewed: 02/27/2022 Elsevier Patient Education  2023 [employer] Food Choices for Gastroesophageal Reflux Disease, Adult When you have gastroesophageal reflux disease (GERD), the foods you eat and your eating habits are very important. Choosing the right foods can help ease your discomfort. Think about working with a food expert (dietitian) to help you make good choices. What are tips for following this plan? Reading food labels Look for foods that are low in saturated fat. Foods that may help with your symptoms include: Foods that have less than 5% of daily value (DV) of fat. Foods that have 0 grams of trans fat. Cooking Do not fry your food. Cook your food by baking, steaming, grilling, or broiling. These are all methods that do not need a lot of fat for cooking. To add flavor, try to use herbs that are low in spice and acidity. Meal planning  Choose healthy foods that are low in fat, such as: Fruits and vegetables. Whole grains. Low-fat dairy products. Lean meats, fish, and  poultry. Eat small meals often instead of eating 3 large meals each day. Eat your meals slowly in a place where you are relaxed. Avoid bending over or lying down until 2-3 hours after eating. Limit high-fat foods such as fatty meats or fried foods. Limit your intake of fatty foods, such as oils, butter, and  shortening. Avoid the following as told by your doctor: Foods that cause symptoms. These may be different for different people. Keep a food diary to keep track of foods that cause symptoms. Alcohol. Drinking a lot of liquid with meals. Eating meals during the 2-3 hours before bed. Lifestyle Stay at a healthy weight. Ask your doctor what weight is healthy for you. If you need to lose weight, work with your doctor to do so safely. Exercise for at least 30 minutes on 5 or more days each week, or as told by your doctor. Wear loose-fitting clothes. Do not smoke or use any products that contain nicotine or tobacco. If you need help quitting, ask your doctor. Sleep with the head of your bed higher than your feet. Use a wedge under the mattress or blocks under the bed frame to raise the head of the bed. Chew sugar-free gum after meals. What foods should eat?  Eat a healthy, well-balanced diet of fruits, vegetables, whole grains, low-fat dairy products, lean meats, fish, and poultry. Each person is different. Foods that may cause symptoms in one person may not cause any symptoms in another person. Work with your doctor to find foods that are safe for you. The items listed above may not be a complete list of what you can eat and drink. Contact a food expert for more options. What foods should I avoid? Limiting some of these foods may help in managing the symptoms of GERD. Everyone is different. Talk with a food expert or your doctor to help you find the exact foods to avoid, if any. Fruits Any fruits prepared with added fat. Any fruits that cause symptoms. For some people, this may include citrus fruits, such as oranges, grapefruit, pineapple, and lemons. Vegetables Deep-fried vegetables. [place] fries. Any vegetables prepared with added fat. Any vegetables that cause symptoms. For some people, this may include tomatoes and tomato products, chili peppers, onions and garlic, and  horseradish. Grains Pastries or quick breads with added fat. Meats and other proteins High-fat meats, such as fatty beef or pork, hot dogs, ribs, ham, sausage, salami, and bacon. Fried meat or protein, including fried fish and fried chicken. Nuts and nut butters, in large amounts. Dairy Whole milk and chocolate milk. Sour cream. Cream. Ice cream. Cream cheese. Milkshakes. Fats and oils Butter. Margarine. Shortening. Ghee. Beverages Coffee and tea, with or without caffeine. Carbonated beverages. Sodas. Energy drinks. Fruit juice made with acidic fruits, such as orange or grapefruit. Tomato juice. Alcoholic drinks. Sweets and desserts Chocolate and cocoa. Donuts. Seasonings and condiments Pepper. Peppermint and spearmint. Added salt. Any condiments, herbs, or seasonings that cause symptoms. For some people, this may include curry, hot sauce, or vinegar-based salad dressings. The items listed above may not be a complete list of what you should not eat and drink. Contact a food expert for more options. Questions to ask your doctor Diet and lifestyle changes are often the first steps that are taken to manage symptoms of GERD. If diet and lifestyle changes do not help, talk with your doctor about taking medicines. Where to find more information [employer] G[employer]intestinal [employer]: aboutgerd.org Summary When you have GERD,  food and lifestyle choices are very important in easing your symptoms. Eat small meals often instead of 3 large meals a day. Eat your meals slowly and in a place where you are relaxed. Avoid bending over or lying down until 2-3 hours after eating. Limit high-fat foods such as fatty meats or fried foods. This information is not intended to replace advice given to you by your health care provider. Make sure you discuss any questions you have with your health care provider. Document Revised: 10/25/2019 Document Reviewed: 10/25/2019 Elsevier Patient Education   2023 [employer].

## 2022-10-14 ENCOUNTER — Encounter: Payer: Self-pay | Admitting: Physician Assistant

## 2022-10-15 ENCOUNTER — Other Ambulatory Visit: Payer: Self-pay | Admitting: Physician Assistant

## 2022-10-15 DIAGNOSIS — K5909 Other constipation: Secondary | ICD-10-CM

## 2022-10-15 MED ORDER — LINACLOTIDE 145 MCG PO CAPS
145.0000 ug | ORAL_CAPSULE | Freq: Every day | ORAL | 5 refills | Status: DC
Start: 2022-10-15 — End: 2023-06-02

## 2022-10-16 ENCOUNTER — Encounter: Payer: Self-pay | Admitting: Radiology

## 2022-10-16 ENCOUNTER — Ambulatory Visit
Admission: RE | Admit: 2022-10-16 | Discharge: 2022-10-16 | Disposition: A | Discharge status: Home / Self Care | Payer: 59 | Source: Ambulatory Visit | Attending: Internal Medicine | Admitting: Internal Medicine

## 2022-10-16 DIAGNOSIS — Z1231 Encounter for screening mammogram for malignant neoplasm of breast: Secondary | ICD-10-CM | POA: Insufficient documentation

## 2022-10-31 NOTE — Progress Notes (Deleted)
Lorre Munroe, NP   No chief complaint on file.   HPI:      [name] is a 47 y.o. [id number] whose LMP was No LMP recorded. (Menstrual status: Irregular Periods)., presents today for ***  Neg GN u/s 3/23; no leio; EM=  Patient Active Problem List   Diagnosis Date Noted   Chronic constipation 02/11/2022   Overweight with body mass index (BMI) of 26 to 26.9 in adult 02/11/2022   GERD (gastroesophageal reflux disease) 03/20/2015   Atrial tachycardia 11/25/2011    Past Surgical History:  Procedure Laterality Date   BREAST BIOPSY Left 05/13/2019   X-clip, stereo bx, BENIGN MAMMARY PARENCHYMA WITH FIBROCYSTIC AND FIBROADENOMATOID   BREAST BIOPSY Right 05/13/2019   stereo bx, CSL, ribbon clip surgical removed   BREAST LUMPECTOMY WITH RADIOACTIVE SEED LOCALIZATION Right 07/23/2019   Procedure: RIGHT BREAST LUMPECTOMY WITH RADIOACTIVE SEED LOCALIZATION;  Surgeon: Griselda Miner, MD;  Location: [facility];  Service: General;  Laterality: Right;   BREAST SURGERY Right 07/23/2019   rt breast CSL removed   TONSILLECTOMY AND ADENOIDECTOMY     WISDOM TOOTH EXTRACTION      Family History  Problem Relation Age of Onset   Endometriosis Mother    Prostate cancer Maternal Grandfather    Brain cancer Maternal Grandfather    Stroke Maternal Grandfather    Drug abuse Maternal Aunt    Drug abuse Maternal Uncle    Colon cancer Neg Hx    Ovarian cancer Neg Hx    Cancer Neg Hx    Diabetes Neg Hx    Heart disease Neg Hx    Breast cancer Neg Hx     Social History   Socioeconomic History   Marital status: Married    Spouse name: Not on file   Number of children: Not on file   Years of education: Not on file   Highest education level: Not on file  Occupational History   Not on file  Tobacco Use   Smoking status: Never   Smokeless tobacco: Never  Vaping Use   Vaping Use: Never used  Substance and Sexual Activity   Alcohol use: Yes    Alcohol/week: 0.0  standard drinks of alcohol    Comment: occasional   Drug use: No   Sexual activity: Yes    Birth control/protection: Surgical    Comment: vasctomy  Other Topics Concern   Not on file  Social History Narrative   ** Merged History Encounter **       Social Determinants of Health   Financial Resource Strain: Not on file  Food Insecurity: Not on file  Transportation Needs: Not on file  Physical Activity: Insufficiently Active (07/07/2017)   Exercise Vital Sign    Days of Exercise per Week: 2 days    Minutes of Exercise per Session: 30 min  Stress: Not on file  Social Connections: Not on file  Intimate Partner Violence: Not on file    Outpatient Medications Prior to Visit  Medication Sig Dispense Refill   linaclotide (LINZESS) 145 MCG CAPS capsule Take 1 capsule (145 mcg total) by mouth daily before breakfast. 30 capsule 5   minoxidil (LONITEN) 2.5 MG tablet TAKE 1 TABLET BY MOUTH EVERY DAY 30 tablet 3   pantoprazole (PROTONIX) 40 MG tablet Take 1 tablet (40 mg total) by mouth daily. 90 tablet 3   spironolactone (ALDACTONE) 100 MG tablet TAKE 1 TABLET BY MOUTH EVERY DAY 30 tablet 3  No facility-administered medications prior to visit.      ROS:  Review of Systems BREAST: No symptoms   OBJECTIVE:   Vitals:  There were no vitals taken for this visit.  Physical Exam  Results: No results found for this or any previous visit (from the past 24 hour(s)).   Assessment/Plan: No diagnosis found.    No orders of the defined types were placed in this encounter.     No follow-ups on file.  Shaneika Rossa B. Hines Kloss, PA-C 10/31/2022 6:42 PM

## 2022-11-01 ENCOUNTER — Ambulatory Visit: Payer: 59 | Admitting: Obstetrics and Gynecology

## 2022-11-08 ENCOUNTER — Other Ambulatory Visit (HOSPITAL_COMMUNITY)
Admission: RE | Admit: 2022-11-08 | Discharge: 2022-11-08 | Disposition: A | Discharge status: Home / Self Care | Payer: 59 | Source: Ambulatory Visit | Attending: Obstetrics and Gynecology | Admitting: Obstetrics and Gynecology

## 2022-11-08 ENCOUNTER — Ambulatory Visit (INDEPENDENT_AMBULATORY_CARE_PROVIDER_SITE_OTHER): Payer: 59 | Admitting: Obstetrics and Gynecology

## 2022-11-08 ENCOUNTER — Encounter: Payer: Self-pay | Admitting: Obstetrics and Gynecology

## 2022-11-08 VITALS — BP 112/73 | HR 74 | Ht 68.0 in | Wt 174.6 lb

## 2022-11-08 DIAGNOSIS — Z124 Encounter for screening for malignant neoplasm of cervix: Secondary | ICD-10-CM | POA: Diagnosis present

## 2022-11-08 DIAGNOSIS — Z01419 Encounter for gynecological examination (general) (routine) without abnormal findings: Secondary | ICD-10-CM | POA: Insufficient documentation

## 2022-11-08 NOTE — Progress Notes (Signed)
HPI:      [name] is a 47 y.o. [id number] who LMP was Patient's last menstrual period was 09/26/2022.  Subjective:   She presents today for her annual emanation.  She is up-to-date on Pap smear and blood work.  She recently had some episode of hair loss and is currently using minoxidil and spironolactone successfully.  She had a missed cycle in October and was tested with an [facility] in December which showed she was not yet menopausal.  Her cycles have been regular since then with the exception of this current cycle.  She has once again skipped a cycle.  She reports no other signs or symptoms of menopause including hot flashes etc.    Hx: The following portions of the patient's history were reviewed and updated as appropriate:             She  has a past medical history of Allergy, Anxiety, Chronic constipation, Endometriosis, Fibrocystic breast, Ovarian cyst, Pelvic pain in female, and Right lower quadrant pain. She does not have any pertinent problems on file. She  has a past surgical history that includes Tonsillectomy and adenoidectomy; Wisdom tooth extraction; Breast biopsy (Left, 05/13/2019); Breast biopsy (Right, 05/13/2019); Breast lumpectomy with radioactive seed localization (Right, 07/23/2019); and Breast surgery (Right, 07/23/2019). Her family history includes Brain cancer in her maternal grandfather; Drug abuse in her maternal aunt and maternal uncle; Endometriosis in her mother; Prostate cancer in her maternal grandfather; Stroke in her maternal grandfather. She  reports that she has never smoked. She has never used smokeless tobacco. She reports current alcohol use. She reports that she does not use drugs. She has a current medication list which includes the following prescription(s): linaclotide, minoxidil, pantoprazole, and spironolactone. She is allergic to citrus, shellfish allergy, tape, metrogel [metronidazole], and tylenol with codeine #3 [acetaminophen-codeine].        Review of Systems:  Review of Systems  Constitutional: Denied constitutional symptoms, night sweats, recent illness, fatigue, fever, insomnia and weight loss.  Eyes: Denied eye symptoms, eye pain, photophobia, vision change and visual disturbance.  Ears/Nose/Throat/Neck: Denied ear, nose, throat or neck symptoms, hearing loss, nasal discharge, sinus congestion and sore throat.  Cardiovascular: Denied cardiovascular symptoms, arrhythmia, chest pain/pressure, edema, exercise intolerance, orthopnea and palpitations.  Respiratory: Denied pulmonary symptoms, asthma, pleuritic pain, productive sputum, cough, dyspnea and wheezing.  Gastrointestinal: Denied, gastro-esophageal reflux, melena, nausea and vomiting.  Genitourinary: See HPI for additional information.  Musculoskeletal: Denied musculoskeletal symptoms, stiffness, swelling, muscle weakness and myalgia.  Dermatologic: Denied dermatology symptoms, rash and scar.  Neurologic: Denied neurology symptoms, dizziness, headache, neck pain and syncope.  Psychiatric: Denied psychiatric symptoms, anxiety and depression.  Endocrine: Denied endocrine symptoms including hot flashes and night sweats.   Meds:   Current Outpatient Medications on File Prior to Visit  Medication Sig Dispense Refill   linaclotide (LINZESS) 145 MCG CAPS capsule Take 1 capsule (145 mcg total) by mouth daily before breakfast. 30 capsule 5   minoxidil (LONITEN) 2.5 MG tablet TAKE 1 TABLET BY MOUTH EVERY DAY 30 tablet 3   pantoprazole (PROTONIX) 40 MG tablet Take 1 tablet (40 mg total) by mouth daily. 90 tablet 3   spironolactone (ALDACTONE) 100 MG tablet TAKE 1 TABLET BY MOUTH EVERY DAY 30 tablet 3   No current facility-administered medications on file prior to visit.      Objective:     Vitals:   11/08/22 0921  BP: 112/73  Pulse: 74   Filed Weights   11/08/22  5573  Weight: 174 lb 9.6 oz (79.2 kg)              Physical examination   Pelvic:   Vulva: Normal  appearance.  No lesions.  Vagina: No lesions or abnormalities noted.  Support: Second-degree rectocele  Urethra No masses tenderness or scarring.  Meatus Normal size without lesions or prolapse.  Cervix: Normal appearance.  No lesions.  Anus: Normal exam.  No lesions.  Perineum: Normal exam.  No lesions.        Bimanual   Uterus: Top normal size.  Non-tender.  Mobile.  AV.  Adnexae: No masses.  Non-tender to palpation.  Cul-de-sac: Negative for abnormality.             Assessment:    [id number] Patient Active Problem List   Diagnosis Date Noted   Chronic constipation 02/11/2022   Overweight with body mass index (BMI) of 26 to 26.9 in adult 02/11/2022   GERD (gastroesophageal reflux disease) 03/20/2015   Atrial tachycardia 11/25/2011     1. Well woman exam with routine gynecological exam   2. Cervical cancer screening     Has experienced 2 missed menses in the last 8 months.  Has also had some hair loss but otherwise no other signs or symptoms of menopause.  FSH 7 months ago was normal   Plan:            1.  Basic Screening Recommendations The basic screening recommendations for asymptomatic women were discussed with the patient during her visit.  The age-appropriate recommendations were discussed with her and the rational for the tests reviewed.  When I am informed by the patient that another primary care physician has previously obtained the age-appropriate tests and they are up-to-date, only outstanding tests are ordered and referrals given as necessary.  Abnormal results of tests will be discussed with her when all of her results are completed.  Routine preventative health maintenance measures emphasized: Exercise/Diet/Weight control, Tobacco Warnings, Alcohol/Substance use risks and Stress Management Pap performed -patient up-to-date on blood work and mammography 2.  She is to inform us if her cycles become irregular or prolonged.  Further workup at that point. She will also  tell us if she begins having menopausal symptoms. Orders No orders of the defined types were placed in this encounter.   No orders of the defined types were placed in this encounter.     F/U  Return in about 1 year (around 11/08/2023) for Annual Physical, Pt to contact us if symptoms worsen.  Elonda Husky, M.D. 11/08/2022 9:38 AM

## 2022-11-08 NOTE — Progress Notes (Signed)
Patients presents for annual exam today. She states two irregular cycles over the last few months, she believes it is due to a medication she has recently started. Patient is due for pap smear, ordered. Up to date with  mammogram. Annual labs are recently preformed by PCP.

## 2022-11-15 LAB — CYTOLOGY - PAP
Comment: NEGATIVE
Diagnosis: UNDETERMINED — AB
High risk HPV: NEGATIVE

## 2022-11-21 ENCOUNTER — Encounter: Payer: Self-pay | Admitting: Gastroenterology

## 2022-11-22 ENCOUNTER — Ambulatory Visit: Payer: 59 | Admitting: Certified Registered"

## 2022-11-22 ENCOUNTER — Encounter
Admission: RE | Disposition: A | Discharge status: Home / Self Care | Payer: Self-pay | Source: Home / Self Care | Attending: Gastroenterology

## 2022-11-22 ENCOUNTER — Ambulatory Visit
Admission: RE | Admit: 2022-11-22 | Discharge: 2022-11-22 | Disposition: A | Discharge status: Home / Self Care | Payer: 59 | Attending: Gastroenterology | Admitting: Gastroenterology

## 2022-11-22 DIAGNOSIS — K219 Gastro-esophageal reflux disease without esophagitis: Secondary | ICD-10-CM

## 2022-11-22 DIAGNOSIS — Z79899 Other long term (current) drug therapy: Secondary | ICD-10-CM | POA: Insufficient documentation

## 2022-11-22 DIAGNOSIS — Z1211 Encounter for screening for malignant neoplasm of colon: Secondary | ICD-10-CM

## 2022-11-22 DIAGNOSIS — R1031 Right lower quadrant pain: Secondary | ICD-10-CM

## 2022-11-22 DIAGNOSIS — K5909 Other constipation: Secondary | ICD-10-CM | POA: Diagnosis not present

## 2022-11-22 DIAGNOSIS — R1013 Epigastric pain: Secondary | ICD-10-CM

## 2022-11-22 DIAGNOSIS — R109 Unspecified abdominal pain: Secondary | ICD-10-CM | POA: Insufficient documentation

## 2022-11-22 DIAGNOSIS — K5900 Constipation, unspecified: Secondary | ICD-10-CM | POA: Diagnosis not present

## 2022-11-22 DIAGNOSIS — R194 Change in bowel habit: Secondary | ICD-10-CM

## 2022-11-22 HISTORY — PX: ESOPHAGOGASTRODUODENOSCOPY (EGD) WITH PROPOFOL: SHX5813

## 2022-11-22 HISTORY — PX: COLONOSCOPY WITH PROPOFOL: SHX5780

## 2022-11-22 HISTORY — PX: BIOPSY: SHX5522

## 2022-11-22 LAB — POCT PREGNANCY, URINE: Preg Test, Ur: NEGATIVE

## 2022-11-22 SURGERY — COLONOSCOPY WITH PROPOFOL
Anesthesia: General

## 2022-11-22 MED ORDER — MIDAZOLAM HCL 2 MG/2ML IJ SOLN
INTRAMUSCULAR | Status: AC
  Filled 2022-11-22: qty 2

## 2022-11-22 MED ORDER — GLYCOPYRROLATE 0.2 MG/ML IJ SOLN
INTRAMUSCULAR | Status: AC
  Filled 2022-11-22: qty 1

## 2022-11-22 MED ORDER — PROPOFOL 1000 MG/100ML IV EMUL
INTRAVENOUS | Status: AC
  Filled 2022-11-22: qty 100

## 2022-11-22 MED ORDER — PROPOFOL 10 MG/ML IV BOLUS
INTRAVENOUS | Status: DC | PRN
Start: 2022-11-22 — End: 2022-11-22
  Administered 2022-11-22: 100 mg via INTRAVENOUS

## 2022-11-22 MED ORDER — PROPOFOL 500 MG/50ML IV EMUL
INTRAVENOUS | Status: DC | PRN
  Administered 2022-11-22: 120 ug/kg/min via INTRAVENOUS

## 2022-11-22 MED ORDER — GLYCOPYRROLATE 0.2 MG/ML IJ SOLN
INTRAMUSCULAR | Status: DC | PRN
  Administered 2022-11-22: .2 mg via INTRAVENOUS

## 2022-11-22 MED ORDER — SODIUM CHLORIDE 0.9 % IV SOLN
INTRAVENOUS | Status: DC
  Administered 2022-11-22: 20 mL/h via INTRAVENOUS

## 2022-11-22 MED ORDER — MIDAZOLAM HCL 5 MG/5ML IJ SOLN
INTRAMUSCULAR | Status: DC | PRN
  Administered 2022-11-22: 2 mg via INTRAVENOUS

## 2022-11-22 MED ORDER — LIDOCAINE HCL (PF) 2 % IJ SOLN
INTRAMUSCULAR | Status: AC
  Filled 2022-11-22: qty 5

## 2022-11-22 MED ORDER — LIDOCAINE 2% (20 MG/ML) 5 ML SYRINGE
INTRAMUSCULAR | Status: DC | PRN
  Administered 2022-11-22: 20 mg via INTRAVENOUS

## 2022-11-22 NOTE — Anesthesia Preprocedure Evaluation (Addendum)
Anesthesia Evaluation  Patient identified by MRN, date of birth, ID band Patient awake    Reviewed: Allergy & Precautions, H&P , NPO status , Patient's Chart, lab work & pertinent test results  Airway Mallampati: II  TM Distance: >3 FB Neck ROM: Full    Dental  (+) Teeth Intact   Pulmonary neg pulmonary ROS   Pulmonary exam normal breath sounds clear to auscultation       Cardiovascular Exercise Tolerance: Good negative cardio ROS Normal cardiovascular exam Rhythm:Regular Rate:Normal     Neuro/Psych   Anxiety     negative neurological ROS  negative psych ROS   GI/Hepatic negative GI ROS, Neg liver ROS,GERD  Medicated,,  Endo/Other  negative endocrine ROS    Renal/GU negative Renal ROS  negative genitourinary   Musculoskeletal negative musculoskeletal ROS (+)    Abdominal Normal abdominal exam  (+)   Peds negative pediatric ROS (+)  Hematology negative hematology ROS (+)   Anesthesia Other Findings Past Medical History: No date: Allergy No date: Anxiety     Comment:  mild No date: Chronic constipation No date: Endometriosis No date: Fibrocystic breast No date: Ovarian cyst No date: Pelvic pain in female No date: Right lower quadrant pain  Past Surgical History: 05/13/2019: BREAST BIOPSY; Left     Comment:  X-clip, stereo bx, BENIGN MAMMARY PARENCHYMA WITH               FIBROCYSTIC AND FIBROADENOMATOID 05/13/2019: BREAST BIOPSY; Right     Comment:  stereo bx, CSL, ribbon clip surgical removed 07/23/2019: BREAST LUMPECTOMY WITH RADIOACTIVE SEED LOCALIZATION;  Right     Comment:  Procedure: RIGHT BREAST LUMPECTOMY WITH RADIOACTIVE SEED              LOCALIZATION;  Surgeon: Griselda Miner, MD;  Location:               [facility];  Service: General;                Laterality: Right; 07/23/2019: BREAST SURGERY; Right     Comment:  rt breast CSL removed No date: TONSILLECTOMY AND  ADENOIDECTOMY No date: WISDOM TOOTH EXTRACTION  BMI    Body Mass Index: 25.85 kg/m      Reproductive/Obstetrics negative OB ROS                              Anesthesia Physical Anesthesia Plan  ASA: 2  Anesthesia Plan: General   Post-op Pain Management:    Induction: Intravenous  PONV Risk Score and Plan: Propofol infusion and TIVA  Airway Management Planned: Natural Airway  Additional Equipment:   Intra-op Plan:   Post-operative Plan:   Informed Consent: I have reviewed the patients History and Physical, chart, labs and discussed the procedure including the risks, benefits and alternatives for the proposed anesthesia with the patient or authorized representative who has indicated his/her understanding and acceptance.     Dental Advisory Given  Plan Discussed with: CRNA and Surgeon  Anesthesia Plan Comments:          Anesthesia Quick Evaluation

## 2022-11-22 NOTE — Transfer of Care (Signed)
Immediate Anesthesia Transfer of Care Note  Patient: Caitlin Castillo  Procedure(s) Performed: COLONOSCOPY WITH PROPOFOL ESOPHAGOGASTRODUODENOSCOPY (EGD) WITH PROPOFOL BIOPSY  Patient Location: Endoscopy Unit  Anesthesia Type:General  Level of Consciousness: sedated  Airway & Oxygen Therapy: Patient Spontanous Breathing  Post-op Assessment: Report given to RN and Post -op Vital signs reviewed and stable  Post vital signs: Reviewed  Last Vitals:  Vitals Value Taken Time  BP 97/53   Temp    Pulse 75 11/22/22 0817  Resp 15 11/22/22 0817  SpO2 99 % 11/22/22 0817  Vitals shown include unfiled device data.  Last Pain:  Vitals:   11/22/22 0718  TempSrc: Temporal  PainSc: 0-No pain         Complications: No notable events documented.

## 2022-11-22 NOTE — Anesthesia Postprocedure Evaluation (Signed)
Anesthesia Post Note  Patient: Caitlin Castillo  Procedure(s) Performed: COLONOSCOPY WITH PROPOFOL ESOPHAGOGASTRODUODENOSCOPY (EGD) WITH PROPOFOL BIOPSY  Patient location during evaluation: PACU Anesthesia Type: General Level of consciousness: awake and awake and alert Pain management: pain level controlled Vital Signs Assessment: post-procedure vital signs reviewed and stable Respiratory status: spontaneous breathing and nonlabored ventilation Cardiovascular status: stable Anesthetic complications: no   No notable events documented.   Last Vitals:  Vitals:   11/22/22 0817 11/22/22 0827  BP: 97/61 93/69  Pulse: 74   Resp: 15   Temp: (!) 36.4 C   SpO2: 99%     Last Pain:  Vitals:   11/22/22 0837  TempSrc:   PainSc: 0-No pain                 VAN STAVEREN,Kourosh Jablonsky

## 2022-11-22 NOTE — H&P (Signed)
Wyline Mood, MD [address], [address], [place], [place], [address] [address], [address], [place], [place], [address] Phone: [phone]  Fax: [phone]  Primary Care Physician:  Lorre Munroe, NP   Pre-Procedure History & Physical: HPI:  SAUNDRIA HENLEY is a 47 y.o. female is here for an endoscopy and colonoscopy    Past Medical History:  Diagnosis Date   Allergy    Anxiety    mild   Chronic constipation    Endometriosis    Fibrocystic breast    Ovarian cyst    Pelvic pain in female    Right lower quadrant pain     Past Surgical History:  Procedure Laterality Date   BREAST BIOPSY Left 05/13/2019   X-clip, stereo bx, BENIGN MAMMARY PARENCHYMA WITH FIBROCYSTIC AND FIBROADENOMATOID   BREAST BIOPSY Right 05/13/2019   stereo bx, CSL, ribbon clip surgical removed   BREAST LUMPECTOMY WITH RADIOACTIVE SEED LOCALIZATION Right 07/23/2019   Procedure: RIGHT BREAST LUMPECTOMY WITH RADIOACTIVE SEED LOCALIZATION;  Surgeon: Griselda Miner, MD;  Location: [facility];  Service: General;  Laterality: Right;   BREAST SURGERY Right 07/23/2019   rt breast CSL removed   TONSILLECTOMY AND ADENOIDECTOMY     WISDOM TOOTH EXTRACTION      Prior to Admission medications   Medication Sig Start Date End Date Taking? Authorizing Provider  linaclotide [facility]) 145 MCG CAPS capsule Take 1 capsule (145 mcg total) by mouth daily before breakfast. 10/15/22 04/13/23 Yes Celso Amy, PA-C  minoxidil (LONITEN) 2.5 MG tablet TAKE 1 TABLET BY MOUTH EVERY DAY 08/05/22  Yes Willeen Niece, MD  pantoprazole (PROTONIX) 40 MG tablet Take 1 tablet (40 mg total) by mouth daily. 09/19/22  Yes Celso Amy, PA-C  spironolactone (ALDACTONE) 100 MG tablet TAKE 1 TABLET BY MOUTH EVERY DAY 08/05/22  Yes Willeen Niece, MD    Allergies as of 09/19/2022 - Review Complete 09/19/2022  Allergen Reaction Noted   Citrus  11/12/2011   Shellfish allergy  11/12/2011   Tape  11/12/2011   Metrogel  [metronidazole] Rash 03/20/2015   Tylenol with codeine #3 [acetaminophen-codeine] Rash 07/07/2017    Family History  Problem Relation Age of Onset   Endometriosis Mother    Prostate cancer Maternal Grandfather    Brain cancer Maternal Grandfather    Stroke Maternal Grandfather    Drug abuse Maternal Aunt    Drug abuse Maternal Uncle    Colon cancer Neg Hx    Ovarian cancer Neg Hx    Cancer Neg Hx    Diabetes Neg Hx    Heart disease Neg Hx    Breast cancer Neg Hx     Social History   Socioeconomic History   Marital status: Married    Spouse name: Not on file   Number of children: Not on file   Years of education: Not on file   Highest education level: Not on file  Occupational History   Not on file  Tobacco Use   Smoking status: Never   Smokeless tobacco: Never  Vaping Use   Vaping status: Never Used  Substance and Sexual Activity   Alcohol use: Yes    Alcohol/week: 0.0 standard drinks of alcohol    Comment: occasional   Drug use: No   Sexual activity: Yes    Birth control/protection: Surgical    Comment: vasctomy  Other Topics Concern   Not on file  Social History Narrative   ** Merged History Encounter **  Social Determinants of Health   Financial Resource Strain: Not on file  Food Insecurity: Not on file  Transportation Needs: Not on file  Physical Activity: Insufficiently Active (07/07/2017)   Exercise Vital Sign    Days of Exercise per Week: 2 days    Minutes of Exercise per Session: 30 min  Stress: Not on file  Social Connections: Not on file  Intimate Partner Violence: Not on file    Review of Systems: See HPI, otherwise negative ROS  Physical Exam: BP 114/89   Pulse 94   Temp (!) 97.1 F (36.2 C) (Temporal)   Resp 20   Ht 5\' 8"  (1.727 m)   Wt 77.1 kg   SpO2 99%   BMI 25.85 kg/m  General:   Alert,  pleasant and cooperative in NAD Head:  Normocephalic and atraumatic. Neck:  Supple; no masses or thyromegaly. Lungs:  Clear  throughout to auscultation, normal respiratory effort.    Heart:  +S1, +S2, Regular rate and rhythm, No edema. Abdomen:  Soft, nontender and nondistended. Normal bowel sounds, without guarding, and without rebound.   Neurologic:  Alert and  oriented x4;  grossly normal neurologically.  Impression/Plan: ALLAYNA ADEL is here for an endoscopy and colonoscopy  to be performed for  evaluation of Abdominal pain and constipation    Risks, benefits, limitations, and alternatives regarding endoscopy have been reviewed with the patient.  Questions have been answered.  All parties agreeable.   Wyline Mood, MD  11/22/2022, 7:47 AM

## 2022-11-22 NOTE — Op Note (Signed)
[facility] Patient Name: Caitlin Castillo Procedure Date: 11/22/2022 7:19 AM MRN: [id number] Account #: [id number] Date of Birth: 29-Apr-1976 Admit Type: Outpatient Age: 47 Room: [facility] ENDO ROOM 4 Gender: Female Note Status: Finalized Instrument Name: Upper Endoscope [id number] Procedure:             Upper GI endoscopy Indications:           Abdominal pain Providers:             Wyline Mood MD, MD Referring MD:          Lorre Munroe (Referring MD) Medicines:             Monitored Anesthesia Care Complications:         No immediate complications. Procedure:             Pre-Anesthesia Assessment:                        - Prior to the procedure, a History and Physical was                         performed, and patient medications, allergies and                         sensitivities were reviewed. The patient's tolerance                         of previous anesthesia was reviewed.                        - The risks and benefits of the procedure and the                         sedation options and risks were discussed with the                         patient. All questions were answered and informed                         consent was obtained.                        - ASA Grade Assessment: II - A patient with mild                         systemic disease.                        After obtaining informed consent, the endoscope was                         passed under direct vision. Throughout the procedure,                         the patient's blood pressure, pulse, and oxygen                         saturations were monitored continuously. The Endoscope                         was introduced  through the mouth, and advanced to the                         third part of duodenum. The upper GI endoscopy was                         accomplished with ease. The patient tolerated the                         procedure well. Findings:      The examined duodenum  was normal.      The esophagus was normal.      The entire examined stomach was normal. Biopsies were taken with a cold       forceps for histology.      The cardia and gastric fundus were normal on retroflexion. Impression:            - Normal examined duodenum.                        - Normal esophagus.                        - Normal stomach. Biopsied. Recommendation:        - Await pathology results.                        - Perform a colonoscopy today. Procedure Code(s):     --- Professional ---                        [id number], Esophagogastroduodenoscopy, flexible,                         transoral; with biopsy, single or multiple Diagnosis Code(s):     --- Professional ---                        R10.9, Unspecified abdominal pain CPT copyright 2022 American Medical Association. All rights reserved. The codes documented in this report are preliminary and upon coder review may  be revised to meet current compliance requirements. Wyline Mood, MD Wyline Mood MD, MD 11/22/2022 7:58:09 AM This report has been signed electronically. Number of Addenda: 0 Note Initiated On: 11/22/2022 7:19 AM Estimated Blood Loss:  Estimated blood loss: none.      [facility]

## 2022-11-22 NOTE — Op Note (Signed)
[facility] Gastroenterology Patient Name: Caitlin Castillo Procedure Date: 11/22/2022 7:19 AM MRN: [id number] Account #: [id number] Date of Birth: [birth year] Admit Type: Outpatient Age: 47 Room: [facility] ENDO ROOM 4 Gender: Female Note Status: Finalized Instrument Name: Prentice Docker [id number] Procedure:             Colonoscopy Indications:           Constipation Providers:             Wyline Mood MD, MD Referring MD:          Lorre Munroe (Referring MD) Medicines:             Monitored Anesthesia Care Complications:         No immediate complications. Procedure:             Pre-Anesthesia Assessment:                        - Prior to the procedure, a History and Physical was                         performed, and patient medications, allergies and                         sensitivities were reviewed. The patient's tolerance                         of previous anesthesia was reviewed.                        - The risks and benefits of the procedure and the                         sedation options and risks were discussed with the                         patient. All questions were answered and informed                         consent was obtained.                        - ASA Grade Assessment: II - A patient with mild                         systemic disease.                        After obtaining informed consent, the colonoscope was                         passed under direct vision. Throughout the procedure,                         the patient's blood pressure, pulse, and oxygen                         saturations were monitored continuously. The                         Colonoscope was introduced through the anus and  advanced to the the cecum, identified by the                         appendiceal orifice. The colonoscopy was performed                         with ease. The patient tolerated the procedure well.                         The  quality of the bowel preparation was good. The                         ileocecal valve, appendiceal orifice, and rectum were                         photographed. Findings:      The perianal and digital rectal examinations were normal.      The entire examined colon appeared normal on direct and retroflexion       views. Impression:            - The entire examined colon is normal on direct and                         retroflexion views.                        - No specimens collected. Recommendation:        - Discharge patient to home (with escort).                        - Resume previous diet.                        - Continue present medications.                        - Repeat colonoscopy in 10 years for screening                         purposes.                        - Return to GI office as previously scheduled. Procedure Code(s):     --- Professional ---                        [phone], Colonoscopy, flexible; diagnostic, including                         collection of specimen(s) by brushing or washing, when                         performed (separate procedure) Diagnosis Code(s):     --- Professional ---                        K59.00, Constipation, unspecified CPT copyright 2022 American Medical Association. All rights reserved. The codes documented in this report are preliminary and upon coder review may  be revised to meet current compliance requirements. Wyline Mood, MD Wyline Mood MD, MD 11/22/2022 8:15:54 AM This report  has been signed electronically. Number of Addenda: 0 Note Initiated On: 11/22/2022 7:19 AM Scope Withdrawal Time: 0 hours 9 minutes 48 seconds  Total Procedure Duration: 0 hours 14 minutes 45 seconds  Estimated Blood Loss:  Estimated blood loss: none.      [facility]

## 2022-11-25 ENCOUNTER — Encounter: Payer: Self-pay | Admitting: Gastroenterology

## 2022-11-27 ENCOUNTER — Encounter: Payer: Self-pay | Admitting: Gastroenterology

## 2022-12-11 ENCOUNTER — Other Ambulatory Visit: Payer: Self-pay | Admitting: Dermatology

## 2022-12-11 DIAGNOSIS — L649 Androgenic alopecia, unspecified: Secondary | ICD-10-CM

## 2023-02-04 ENCOUNTER — Other Ambulatory Visit: Payer: Self-pay | Admitting: General Surgery

## 2023-02-04 DIAGNOSIS — N6313 Unspecified lump in the right breast, lower outer quadrant: Secondary | ICD-10-CM | POA: Insufficient documentation

## 2023-02-18 ENCOUNTER — Ambulatory Visit
Admission: RE | Admit: 2023-02-18 | Discharge: 2023-02-18 | Disposition: A | Discharge status: Home / Self Care | Payer: 59 | Source: Ambulatory Visit | Attending: General Surgery | Admitting: General Surgery

## 2023-02-18 DIAGNOSIS — N6313 Unspecified lump in the right breast, lower outer quadrant: Secondary | ICD-10-CM

## 2023-02-24 ENCOUNTER — Ambulatory Visit: Payer: 59 | Admitting: Dermatology

## 2023-03-12 ENCOUNTER — Ambulatory Visit: Payer: 59 | Admitting: Internal Medicine

## 2023-03-12 ENCOUNTER — Encounter: Payer: Self-pay | Admitting: Internal Medicine

## 2023-03-12 VITALS — BP 110/70 | HR 67 | Ht 68.0 in | Wt 173.8 lb

## 2023-03-12 DIAGNOSIS — K219 Gastro-esophageal reflux disease without esophagitis: Secondary | ICD-10-CM | POA: Diagnosis not present

## 2023-03-12 DIAGNOSIS — E663 Overweight: Secondary | ICD-10-CM

## 2023-03-12 DIAGNOSIS — K5909 Other constipation: Secondary | ICD-10-CM

## 2023-03-12 DIAGNOSIS — L659 Nonscarring hair loss, unspecified: Secondary | ICD-10-CM | POA: Diagnosis not present

## 2023-03-12 DIAGNOSIS — Z6826 Body mass index (BMI) 26.0-26.9, adult: Secondary | ICD-10-CM

## 2023-03-12 NOTE — Assessment & Plan Note (Signed)
Try to identify and avoid foods that trigger reflux Encourage weight loss as this can help reduce reflux symptoms Continue pantoprazole as needed

## 2023-03-12 NOTE — Assessment & Plan Note (Signed)
Encouraged diet and exercise for weight loss ?

## 2023-03-12 NOTE — Assessment & Plan Note (Signed)
Continue minoxidil and spironolactone per dermatology

## 2023-03-12 NOTE — Progress Notes (Signed)
Subjective:    Patient ID: Caitlin Castillo, female    DOB: 25-Oct-1975, 47 y.o.   MRN: [id number]  HPI  Patient presents to clinic today for follow-up of chronic conditions.  Chronic constipation: She has a BM almost daily on linzess.  She follows with GI.  Colonoscopy from 10/2022 reviewed.  Hair thinning: Managed on minoxidil and spironolactone. She follows with dermatology.  GERD: Triggered by most things she eats.  She takes pantoprazole only as needed.  Upper GI from 10/2022 reviewed.  Review of Systems     Past Medical History:  Diagnosis Date   Allergy    Anxiety    mild   Chronic constipation    Endometriosis    Fibrocystic breast    Ovarian cyst    Pelvic pain in female    Right lower quadrant pain     Current Outpatient Medications  Medication Sig Dispense Refill   linaclotide (LINZESS) 145 MCG CAPS capsule Take 1 capsule (145 mcg total) by mouth daily before breakfast. 30 capsule 5   minoxidil (LONITEN) 2.5 MG tablet TAKE 1 TABLET BY MOUTH EVERY DAY 30 tablet 3   pantoprazole (PROTONIX) 40 MG tablet Take 1 tablet (40 mg total) by mouth daily. 90 tablet 3   spironolactone (ALDACTONE) 100 MG tablet TAKE 1 TABLET BY MOUTH EVERY DAY 30 tablet 3   No current facility-administered medications for this visit.    Allergies  Allergen Reactions   Citrus    Shellfish Allergy    Tape    Metrogel [Metronidazole] Rash    ??    Tylenol With Codeine #3 [Acetaminophen-Codeine] Rash    Family History  Problem Relation Age of Onset   Endometriosis Mother    Prostate cancer Maternal Grandfather    Brain cancer Maternal Grandfather    Stroke Maternal Grandfather    Drug abuse Maternal Aunt    Drug abuse Maternal Uncle    Colon cancer Neg Hx    Ovarian cancer Neg Hx    Cancer Neg Hx    Diabetes Neg Hx    Heart disease Neg Hx    Breast cancer Neg Hx     Social History   Socioeconomic History   Marital status: Married    Spouse name: Not on file   Number of  children: Not on file   Years of education: Not on file   Highest education level: Not on file  Occupational History   Not on file  Tobacco Use   Smoking status: Never   Smokeless tobacco: Never  Vaping Use   Vaping status: Never Used  Substance and Sexual Activity   Alcohol use: Yes    Alcohol/week: 0.0 standard drinks of alcohol    Comment: occasional   Drug use: No   Sexual activity: Yes    Birth control/protection: Surgical    Comment: vasctomy  Other Topics Concern   Not on file  Social History Narrative   ** Merged History Encounter **       Social Determinants of Health   Financial Resource Strain: Not on file  Food Insecurity: Not on file  Transportation Needs: Not on file  Physical Activity: Insufficiently Active (07/07/2017)   Exercise Vital Sign    Days of Exercise per Week: 2 days    Minutes of Exercise per Session: 30 min  Stress: Not on file  Social Connections: Not on file  Intimate Partner Violence: Not on file     Constitutional: Denies fever, malaise, fatigue,  headache or abrupt weight changes.  HEENT: Pt reports thinning hair. Denies eye pain, eye redness, ear pain, ringing in the ears, wax buildup, runny nose, nasal congestion, bloody nose, or sore throat. Respiratory: Denies difficulty breathing, shortness of breath, cough or sputum production.   Cardiovascular: Denies chest pain, chest tightness, palpitations or swelling in the hands or feet.  Gastrointestinal: Pt reports chronic constipation, reflux. Denies abdominal pain, bloating, constipation, diarrhea or blood in the stool.  GU: Denies urgency, frequency, pain with urination, burning sensation, blood in urine, odor or discharge. Musculoskeletal: Denies decrease in range of motion, difficulty with gait, muscle pain or joint pain and swelling.  Skin: Denies redness, rashes, lesions or ulcercations.  Neurological: Denies dizziness, difficulty with memory, difficulty with speech or problems with  balance and coordination.  Psych: Denies anxiety, depression, SI/HI.  No other specific complaints in a complete review of systems (except as listed in HPI above).  Objective:   Physical Exam   BP 110/70   Pulse 67   Ht 5\' 8"  (1.727 m)   Wt 173 lb 12.8 oz (78.8 kg)   LMP 02/04/2023 (Approximate)   SpO2 98%   BMI 26.43 kg/m  Wt Readings from Last 3 Encounters:  03/12/23 173 lb 12.8 oz (78.8 kg)  11/22/22 170 lb (77.1 kg)  11/08/22 174 lb 9.6 oz (79.2 kg)    General: Appears her stated age, overweight,  in NAD. Skin: Warm, dry and intact.  HEENT: Head: normal shape and size; Eyes: sclera white, no icterus, conjunctiva pink, PERRLA and EOMs intact;  Cardiovascular: Normal rate and rhythm. S1,S2 noted.  No murmur, rubs or gallops noted. No JVD or BLE edema. No carotid bruits noted. Pulmonary/Chest: Normal effort and positive vesicular breath sounds. No respiratory distress. No wheezes, rales or ronchi noted.  Abdomen: Soft and nontender. Normal bowel sounds.  Musculoskeletal:  No difficulty with gait.  Neurological: Alert and oriented. Coordination normal.  Psychiatric: Mood and affect normal. Behavior is normal. Judgment and thought content normal.     BMET    Component Value Date/Time   NA 137 09/05/2022 0909   NA 136 07/18/2021 1119   K 4.2 09/05/2022 0909   CL 104 09/05/2022 0909   CO2 25 09/05/2022 0909   GLUCOSE 83 09/05/2022 0909   BUN 16 09/05/2022 0909   BUN 12 07/18/2021 1119   CREATININE 0.80 09/05/2022 0909   CALCIUM 9.5 09/05/2022 0909    Lipid Panel     Component Value Date/Time   CHOL 148 09/05/2022 0909   CHOL 146 07/18/2021 1119   TRIG 69 09/05/2022 0909   HDL 71 09/05/2022 0909   HDL 67 07/18/2021 1119   CHOLHDL 2.1 09/05/2022 0909   LDLCALC 62 09/05/2022 0909    CBC    Component Value Date/Time   WBC 5.8 09/05/2022 0909   RBC 5.00 09/05/2022 0909   HGB 15.1 09/05/2022 0909   HGB 14.2 07/18/2021 1119   HCT 45.4 (H) 09/05/2022 0909    HCT 41.6 07/18/2021 1119   PLT 276 09/05/2022 0909   PLT 280 07/18/2021 1119   MCV 90.8 09/05/2022 0909   MCV 89 07/18/2021 1119   MCV 82 02/14/2012 0359   MCH 30.2 09/05/2022 0909   MCHC 33.3 09/05/2022 0909   RDW 12.5 09/05/2022 0909   RDW 12.2 07/18/2021 1119   RDW 13.9 02/14/2012 0359   LYMPHSABS 2.3 02/14/2012 0359   MONOABS 0.6 02/14/2012 0359   EOSABS 0.0 02/14/2012 0359   BASOSABS 0.0 02/14/2012  0359    Hgb A1C Lab Results  Component Value Date   HGBA1C 5.3 09/05/2022           Assessment & Plan:    RTC in 6 months for your annual exam

## 2023-03-12 NOTE — Patient Instructions (Signed)
 High-Fiber Eating Plan Fiber, also called dietary fiber, is found in foods such as fruits, vegetables, whole grains, and beans. A high-fiber diet can be good for your health. Your health care provider may recommend a high-fiber diet to help: Prevent trouble pooping (constipation). Lower your cholesterol. Treat the following conditions: Hemorrhoids. This is inflammation of veins in the anus. Inflammation of specific areas of the digestive tract. Irritable bowel syndrome (IBS). This is a problem of the large intestine, also called the colon, that sometimes causes belly pain and bloating. Prevent overeating as part of a weight-loss plan. Lower the risk of heart disease, type 2 diabetes, and certain cancers. What are tips for following this plan? Reading food labels  Check the nutrition facts label on foods for the amount of dietary fiber. Choose foods that have 4 grams of fiber or more per serving. The recommended goals for how much fiber you should eat each day include: Males 40 years old or younger: 30-34 g. Males over 37 years old: 28-34 g. Females 64 years old or younger: 25-28 g. Females over 69 years old: 22-25 g. Your daily fiber goal is _____________ g. Shopping Choose whole fruits and vegetables instead of processed. For example, choose apples instead of apple juice or applesauce. Choose a variety of high-fiber foods such as avocados, lentils, oats, and pinto beans. Read the nutrition facts label on foods. Check for foods with added fiber. These foods often have high sugar and salt (sodium) amounts per serving. Cooking Use whole-grain flour for baking and cooking. Cook with brown rice instead of white rice. Make meals that have a lot of beans and vegetables in them, such as chili or vegetable-based soups. Meal planning Start the day with a breakfast that is high in fiber, such as a cereal that has 5 g of fiber or more per serving. Eat breads and cereals that are made with  whole-grain flour instead of refined flour or white flour. Eat brown rice, bulgur wheat, or millet instead of white rice. Use beans in place of meat in soups, salads, and pasta dishes. Be sure that half of the grains you eat each day are whole grains. General information You can get the recommended amount of dietary fiber by: Eating a variety of fruits, vegetables, grains, nuts, and beans. Taking a fiber supplement if you aren't able to eat enough fiber. It's better to get fiber through food than from a supplement. Slowly increase how much fiber you eat. If you increase the amount of fiber you eat too quickly, you may have bloating, cramping, or gas. Drink plenty of water to help you digest fiber. Choose high-fiber snacks, such as berries, raw vegetables, nuts, and popcorn. What foods should I eat? Fruits Berries. Pears. Apples. Oranges. Avocado. Prunes and raisins. Dried figs. Vegetables Sweet potatoes. Spinach. Kale. Artichokes. Cabbage. Broccoli. Cauliflower. Green peas. Carrots. Squash. Grains Whole-grain breads. Multigrain cereal. Oats and oatmeal. Brown rice. Barley. Bulgur wheat. Millet. Quinoa. Bran muffins. Popcorn. Rye wafer crackers. Meats and other proteins Navy beans, kidney beans, and pinto beans. Soybeans. Split peas. Lentils. Nuts and seeds. Dairy Fiber-fortified yogurt. Fortified means that fiber has been added to the product. Beverages Fiber-fortified soy milk. Fiber-fortified orange juice. Other foods Fiber bars. The items listed above may not be all the foods and drinks you can have. Talk to a dietitian to learn more. What foods should I avoid? Fruits Fruit juice. Cooked, strained fruit. Vegetables Fried potatoes. Canned vegetables. Well-cooked vegetables. Grains White bread. Pasta made with refined  flour. White rice. Meats and other proteins Fatty meat. Fried chicken or fried fish. Dairy Milk. Cream cheese. Sour cream. Fats and  oils Butters. Beverages Soft drinks. Other foods Cakes and pastries. The items listed above may not be all the foods and drinks you should avoid. Talk to a dietitian to learn more. This information is not intended to replace advice given to you by your health care provider. Make sure you discuss any questions you have with your health care provider. Document Revised: 07/08/2022 Document Reviewed: 07/08/2022 Elsevier Patient Education  2024 [employer].

## 2023-03-12 NOTE — Assessment & Plan Note (Signed)
Encouraged high-fiber diet and adequate water intake Continue Linzess as needed

## 2023-04-02 ENCOUNTER — Ambulatory Visit: Payer: 59 | Admitting: Dermatology

## 2023-04-09 ENCOUNTER — Ambulatory Visit: Payer: 59 | Admitting: Dermatology

## 2023-06-01 ENCOUNTER — Other Ambulatory Visit: Payer: Self-pay | Admitting: Physician Assistant

## 2023-06-01 DIAGNOSIS — K5909 Other constipation: Secondary | ICD-10-CM

## 2023-06-15 ENCOUNTER — Other Ambulatory Visit: Payer: Self-pay | Admitting: Dermatology

## 2023-06-15 DIAGNOSIS — L649 Androgenic alopecia, unspecified: Secondary | ICD-10-CM

## 2023-07-08 ENCOUNTER — Other Ambulatory Visit: Payer: Self-pay | Admitting: Dermatology

## 2023-07-08 DIAGNOSIS — L649 Androgenic alopecia, unspecified: Secondary | ICD-10-CM

## 2023-09-09 ENCOUNTER — Encounter: Payer: 59 | Admitting: Internal Medicine

## 2023-09-25 ENCOUNTER — Encounter: Payer: Self-pay | Admitting: Internal Medicine

## 2023-09-25 ENCOUNTER — Ambulatory Visit (INDEPENDENT_AMBULATORY_CARE_PROVIDER_SITE_OTHER): Admitting: Internal Medicine

## 2023-09-25 VITALS — BP 100/66 | Ht 68.0 in | Wt 151.0 lb

## 2023-09-25 DIAGNOSIS — R739 Hyperglycemia, unspecified: Secondary | ICD-10-CM | POA: Diagnosis not present

## 2023-09-25 DIAGNOSIS — Z136 Encounter for screening for cardiovascular disorders: Secondary | ICD-10-CM

## 2023-09-25 DIAGNOSIS — Z0000 Encounter for general adult medical examination without abnormal findings: Secondary | ICD-10-CM

## 2023-09-25 MED ORDER — LINACLOTIDE 290 MCG PO CAPS: 290.0000 ug | ORAL_CAPSULE | Freq: Every day | ORAL | 1 refills | Status: DC

## 2023-09-25 NOTE — Progress Notes (Signed)
 Subjective:    Patient ID: Caitlin Castillo, female    DOB: 12/28/1975, 48 y.o.   MRN: [id number]  HPI  Patient presents to clinic today for her annual exam.  Flu: 01/2023 Tetanus: 02/2019 COVID: Never Pap smear: 11/2019 Mammogram: 01/2023 Colonoscopy: 10/2022 Vision screening: annually Dentist: biannually  Diet: She does eat meat. She consumes fruits and veggies. She tries to avoid fried foods. She drinks mostly water or coffee. Exercise: Walking  Review of Systems  Past Medical History:  Diagnosis Date   Allergy    Anxiety    mild   Chronic constipation    Endometriosis    Fibrocystic breast    Ovarian cyst    Pelvic pain in female    Right lower quadrant pain     Current Outpatient Medications  Medication Sig Dispense Refill   LINZESS  145 MCG CAPS capsule TAKE 1 CAPSULE BY MOUTH DAILY BEFORE BREAKFAST. 30 capsule 5   minoxidil  (LONITEN ) 2.5 MG tablet TAKE 1 TABLET BY MOUTH EVERY DAY 30 tablet 3   pantoprazole  (PROTONIX ) 40 MG tablet Take 1 tablet (40 mg total) by mouth daily. 90 tablet 3   spironolactone  (ALDACTONE ) 100 MG tablet TAKE 1 TABLET BY MOUTH EVERY DAY 30 tablet 3   No current facility-administered medications for this visit.    Allergies  Allergen Reactions   Citrus    Shellfish Allergy    Tape    Metrogel [Metronidazole] Rash    ??    Tylenol  With Codeine #3 [Acetaminophen -Codeine] Rash    Family History  Problem Relation Age of Onset   Endometriosis Mother    Prostate cancer Maternal Grandfather    Brain cancer Maternal Grandfather    Stroke Maternal Grandfather    Drug abuse Maternal Aunt    Drug abuse Maternal Uncle    Colon cancer Neg Hx    Ovarian cancer Neg Hx    Cancer Neg Hx    Diabetes Neg Hx    Heart disease Neg Hx    Breast cancer Neg Hx     Social History   Socioeconomic History   Marital status: Married    Spouse name: Not on file   Number of children: Not on file   Years of education: Not on file   Highest  education level: Not on file  Occupational History   Not on file  Tobacco Use   Smoking status: Never   Smokeless tobacco: Never  Vaping Use   Vaping status: Never Used  Substance and Sexual Activity   Alcohol use: Yes    Alcohol/week: 0.0 standard drinks of alcohol    Comment: occasional   Drug use: No   Sexual activity: Yes    Birth control/protection: Surgical    Comment: vasctomy  Other Topics Concern   Not on file  Social History Narrative   ** Merged History Encounter **       Social Drivers of Health   Financial Resource Strain: Not on file  Food Insecurity: Not on file  Transportation Needs: Not on file  Physical Activity: Insufficiently Active (07/07/2017)   Exercise Vital Sign    Days of Exercise per Week: 2 days    Minutes of Exercise per Session: 30 min  Stress: Not on file  Social Connections: Not on file  Intimate Partner Violence: Not on file     Constitutional: Denies fever, malaise, fatigue, headache or abrupt weight changes.  HEENT: Denies eye pain, eye redness, ear pain, ringing in the ears,  wax buildup, runny nose, nasal congestion, bloody nose, or sore throat. Respiratory: Denies difficulty breathing, shortness of breath, cough or sputum production.   Cardiovascular: Denies chest pain, chest tightness, palpitations or swelling in the hands or feet.  Gastrointestinal: Patient reports constipation.  Denies abdominal pain, bloating, diarrhea or blood in the stool.  GU: Denies urgency, frequency, pain with urination, burning sensation, blood in urine, odor or discharge. Musculoskeletal: Denies decrease in range of motion, difficulty with gait, muscle pain or joint pain and swelling.  Skin: Patient is incision to bilateral axilla and bruising of the chest wall (recent breast augmentation).  Denies redness, rashes, lesions or ulcercations.  Neurological: Denies dizziness, difficulty with memory, difficulty with speech or problems with balance and  coordination.  Psych: Denies anxiety, depression, SI/HI.  No other specific complaints in a complete review of systems (except as listed in HPI above).     Objective:   Physical Exam BP 100/66 (BP Location: Right Arm, Patient Position: Sitting, Cuff Size: Normal)   Ht 5\' 8"  (1.727 m)   Wt 151 lb (68.5 kg)   LMP 09/17/2023 (Exact Date)   BMI 22.96 kg/m    Wt Readings from Last 3 Encounters:  03/12/23 173 lb 12.8 oz (78.8 kg)  11/22/22 170 lb (77.1 kg)  11/08/22 174 lb 9.6 oz (79.2 kg)    General: Appears her stated age, in NAD. Skin: Warm, dry and intact. No rashes, lesions or ulcerations noted.  Bruising noted of bilateral breast and overlying the sternum.  Incisions noted of bilateral axilla with surgical tape still intact. HEENT: Head: normal shape and size; Eyes: sclera white, no icterus, conjunctiva pink, PERRLA and EOMs intact;  Neck:  Neck supple, trachea midline. No masses, lumps or thyromegaly present.  Cardiovascular: Normal rate and rhythm. S1,S2 noted.  No murmur, rubs or gallops noted. No JVD or BLE edema. Pulmonary/Chest: Normal effort and positive vesicular breath sounds. No respiratory distress. No wheezes, rales or ronchi noted.  Abdomen: Soft and nontender. Normal bowel sounds.  Musculoskeletal: Strength 5/5 BUE/BLE.  No difficulty with gait.  Neurological: Alert and oriented. Cranial nerves II-XII grossly intact. Coordination normal.  Psychiatric: Mood and affect normal. Behavior is normal. Judgment and thought content normal.    BMET    Component Value Date/Time   NA 137 09/05/2022 0909   NA 136 07/18/2021 1119   K 4.2 09/05/2022 0909   CL 104 09/05/2022 0909   CO2 25 09/05/2022 0909   GLUCOSE 83 09/05/2022 0909   BUN 16 09/05/2022 0909   BUN 12 07/18/2021 1119   CREATININE 0.80 09/05/2022 0909   CALCIUM 9.5 09/05/2022 0909    Lipid Panel     Component Value Date/Time   CHOL 148 09/05/2022 0909   CHOL 146 07/18/2021 1119   TRIG 69 09/05/2022  0909   HDL 71 09/05/2022 0909   HDL 67 07/18/2021 1119   CHOLHDL 2.1 09/05/2022 0909   LDLCALC 62 09/05/2022 0909    CBC    Component Value Date/Time   WBC 5.8 09/05/2022 0909   RBC 5.00 09/05/2022 0909   HGB 15.1 09/05/2022 0909   HGB 14.2 07/18/2021 1119   HCT 45.4 (H) 09/05/2022 0909   HCT 41.6 07/18/2021 1119   PLT 276 09/05/2022 0909   PLT 280 07/18/2021 1119   MCV 90.8 09/05/2022 0909   MCV 89 07/18/2021 1119   MCV 82 02/14/2012 0359   MCH 30.2 09/05/2022 0909   MCHC 33.3 09/05/2022 0909   RDW 12.5 09/05/2022  4098   RDW 12.2 07/18/2021 1119   RDW 13.9 02/14/2012 0359   LYMPHSABS 2.3 02/14/2012 0359   MONOABS 0.6 02/14/2012 0359   EOSABS 0.0 02/14/2012 0359   BASOSABS 0.0 02/14/2012 0359    Hgb A1C Lab Results  Component Value Date   HGBA1C 5.3 09/05/2022           Assessment & Plan:   Preventative Health Maintenance:  Encouraged her to get a flu shot in the fall Tetanus UTD  Encouraged her to get her COVID-vaccine Pap smear UTD Mammogram will be delayed as she just had breast augmentation Colon screening UTD Encouraged her to consume a balanced diet and exercise regimen Advised her to see an eye doctor and dentist annually We will check CBC, c-Met, lipid, A1c today  RTC in 6 months, follow-up chronic conditions Helayne Lo, NP

## 2023-09-25 NOTE — Patient Instructions (Signed)

## 2023-09-26 ENCOUNTER — Ambulatory Visit: Payer: Self-pay | Admitting: Internal Medicine

## 2023-11-04 ENCOUNTER — Ambulatory Visit: Payer: 59 | Admitting: Dermatology

## 2023-11-05 ENCOUNTER — Encounter: Payer: Self-pay | Admitting: Dermatology

## 2023-11-05 ENCOUNTER — Ambulatory Visit: Admitting: Dermatology

## 2023-11-05 DIAGNOSIS — L649 Androgenic alopecia, unspecified: Secondary | ICD-10-CM | POA: Diagnosis not present

## 2023-11-05 DIAGNOSIS — L650 Telogen effluvium: Secondary | ICD-10-CM | POA: Diagnosis not present

## 2023-11-05 DIAGNOSIS — D2272 Melanocytic nevi of left lower limb, including hip: Secondary | ICD-10-CM

## 2023-11-05 DIAGNOSIS — B360 Pityriasis versicolor: Secondary | ICD-10-CM | POA: Diagnosis not present

## 2023-11-05 DIAGNOSIS — L821 Other seborrheic keratosis: Secondary | ICD-10-CM

## 2023-11-05 DIAGNOSIS — W908XXA Exposure to other nonionizing radiation, initial encounter: Secondary | ICD-10-CM

## 2023-11-05 DIAGNOSIS — D1801 Hemangioma of skin and subcutaneous tissue: Secondary | ICD-10-CM

## 2023-11-05 DIAGNOSIS — D2362 Other benign neoplasm of skin of left upper limb, including shoulder: Secondary | ICD-10-CM

## 2023-11-05 DIAGNOSIS — L578 Other skin changes due to chronic exposure to nonionizing radiation: Secondary | ICD-10-CM

## 2023-11-05 DIAGNOSIS — Z79899 Other long term (current) drug therapy: Secondary | ICD-10-CM

## 2023-11-05 DIAGNOSIS — Z1283 Encounter for screening for malignant neoplasm of skin: Secondary | ICD-10-CM

## 2023-11-05 DIAGNOSIS — Z808 Family history of malignant neoplasm of other organs or systems: Secondary | ICD-10-CM

## 2023-11-05 DIAGNOSIS — D2271 Melanocytic nevi of right lower limb, including hip: Secondary | ICD-10-CM

## 2023-11-05 DIAGNOSIS — D229 Melanocytic nevi, unspecified: Secondary | ICD-10-CM

## 2023-11-05 DIAGNOSIS — L814 Other melanin hyperpigmentation: Secondary | ICD-10-CM

## 2023-11-05 MED ORDER — MINOXIDIL 2.5 MG PO TABS: 2.5000 mg | ORAL_TABLET | Freq: Every day | ORAL | 6 refills | Status: AC

## 2023-11-05 MED ORDER — SPIRONOLACTONE 100 MG PO TABS: 100.0000 mg | ORAL_TABLET | Freq: Every day | ORAL | 11 refills | Status: AC

## 2023-11-05 MED ORDER — SELENIUM SULFIDE 2.5 % EX LOTN: 1.0000 | TOPICAL_LOTION | CUTANEOUS | 12 refills | Status: DC

## 2023-11-05 NOTE — Patient Instructions (Signed)

## 2023-11-05 NOTE — Progress Notes (Signed)
 Follow-Up Visit   Subjective  Caitlin Castillo is a 48 y.o. female who presents for the following: Skin Cancer Screening and Full Body Skin Exam, Alopecia, scalp, Spironolactone  100mg  qd, Minoxidil  2.5mg  1/2 qd, no s/e other than increased hair growth on face, pt has had some increased shedding recently, pt stated she had started Weight Watchers and has lost 35lbs since April, pt also had surgery in May.  Eats chicken and fish, but not much red meat.  The patient presents for Total-Body Skin Exam (TBSE) for skin cancer screening and mole check. The patient has spots, moles and lesions to be evaluated, some may be new or changing and the patient may have concern these could be cancer.    The following portions of the chart were reviewed this encounter and updated as appropriate: medications, allergies, medical history  Review of Systems:  No other skin or systemic complaints except as noted in HPI or Assessment and Plan.  Objective  Well appearing patient in no apparent distress; mood and affect are within normal limits.  A full examination was performed including scalp, head, eyes, ears, nose, lips, neck, chest, axillae, abdomen, back, buttocks, bilateral upper extremities, bilateral lower extremities, hands, feet, fingers, toes, fingernails, and toenails. All findings within normal limits unless otherwise noted below.   Relevant physical exam findings are noted in the Assessment and Plan.    Assessment & Plan   SKIN CANCER SCREENING PERFORMED TODAY.  ACTINIC DAMAGE - Chronic condition, secondary to cumulative UV/sun exposure - diffuse scaly erythematous macules with underlying dyspigmentation - Recommend daily broad spectrum sunscreen SPF 30+ to sun-exposed areas, reapply every 2 hours as needed.  - Staying in the shade or wearing long sleeves, sun glasses (UVA+UVB protection) and wide brim hats (4-inch brim around the entire circumference of the hat) are also recommended for sun  protection.  - Call for new or changing lesions.  LENTIGINES, SEBORRHEIC KERATOSES, HEMANGIOMAS - Benign normal skin lesions - Benign-appearing - Call for any changes  MELANOCYTIC NEVI - Tan-brown and/or pink-flesh-colored symmetric macules and papules - R plantar foot 3.26mm light brown macule  - L plantar foot 7.65mm tan light brown macule - L plantar foot 3.80mm tan light brown macule - Benign appearing on exam today - Observation - Call clinic for new or changing moles - Recommend daily use of broad spectrum spf 30+ sunscreen to sun-exposed areas.   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 Condition is improving with treatment but not currently at goal.   Female Androgenic Alopecia is a chronic condition related to genetics and/or hormonal changes.  In women androgenetic alopecia is commonly associated with menopause but may occur any time after puberty.  It causes hair thinning primarily on the crown with widening of the part and temporal hairline recession.  Can use OTC Rogaine  (minoxidil ) 5% solution/foam as directed.  Oral treatments in female patients who have no contraindication may include : - Low dose oral minoxidil  1.25 - 5mg  daily - Spironolactone  50 - 100mg  bid - Finasteride 2.5 - 5 mg daily Adjunctive therapies include: - Low Level Laser Light Therapy (LLLT) - Platelet-rich plasma injections (PRP) - Hair Transplants or scalp reduction  Treatment Plan: Check labs, TSH, Ferritin, VitD Cont Minoxidil  2.5mg  1/2 po qd Cont Spironolactone  100mg  1 po every day Make sure diet contains enough protein  Doses of oral minoxidil  for  hair loss are considered 'low dose'. This is because the doses used for hair loss are much lower than the doses which are used for conditions such as high blood pressure (hypertension). The doses used for hypertension are 10-40mg  per day.  Side effects are uncommon at the low doses (up to 2.5 mg/day) used to treat hair loss. Potential side effects, more commonly seen at higher doses, include: Increase in hair growth (hypertrichosis) elsewhere on face and body Temporary hair shedding upon starting medication which may last up to 4 weeks Ankle swelling, fluid retention, rapid weight gain more than 5 pounds Low blood pressure and feeling lightheaded or dizzy when standing up quickly Fast or irregular heartbeat Headaches  Spironolactone  can cause increased urination and cause blood pressure to decrease. Please watch for signs of lightheadedness and be cautious when changing position. It can sometimes cause breast tenderness or an irregular period in premenopausal women. It can also increase potassium. The increase in potassium usually is not a concern unless you are taking other medicines that also increase potassium, so please be sure your doctor knows all of the other medications you are taking. This medication should not be taken by pregnant women.  This medicine should also not be taken together with sulfa drugs like Bactrim (trimethoprim /sulfamethexazole).   Long term medication management.  Patient is using long term (months to years) prescription medication  to control their dermatologic condition.  These medications require periodic monitoring to evaluate for efficacy and side effects and may require periodic laboratory monitoring.    BLUE NEVUS L upper arm Exam: 2.51mm med dark brown/blue papule, regular border and pigment  Treatment Plan: Benign-appearing. Stable compared to previous visit. Observation.  Call clinic for new or changing moles.  Recommend daily use of broad spectrum spf 30+  sunscreen to sun-exposed areas.    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 Condition is symptomatic/ bothersome to patient. Not currently at goal.    Tinea versicolor is a chronic recurrent skin rash causing discolored scaly spots most commonly seen on back, chest, and/or shoulders.  It is generally asymptomatic. The rash is due to overgrowth of a common type of yeast present on everyone's skin and it is not contagious.  It tends to flare more in the summer due to increased sweating on trunk.  After rash is treated, the scaliness will resolve, but the discoloration will take longer to return to normal pigmentation. The periodic use of an OTC medicated soap/shampoo with zinc or selenium  sulfide can be helpful to prevent yeast overgrowth and recurrence.  Treatment: Cont Selsun  2.5% shampoo 3x/wk let sit 5 minutes and rinse off ANDROGENETIC ALOPECIA   Related Procedures Ferritin TSH Vitamin D , 25-hydroxy Related Medications minoxidil  (LONITEN ) 2.5 MG tablet Take 1 tablet (2.5 mg total) by mouth daily. spironolactone  (ALDACTONE ) 100 MG tablet Take 1 tablet (100 mg total) by mouth daily. Return in about 1 year (around 11/04/2024) for TBSE.  I, Grayce Saunas, RMA, am acting as scribe for Rexene Rattler, MD .   Documentation: I have reviewed the above documentation for accuracy and completeness, and I agree with the above.  Rexene Rattler, MD

## 2023-11-20 ENCOUNTER — Other Ambulatory Visit (HOSPITAL_COMMUNITY)
Admission: RE | Admit: 2023-11-20 | Discharge: 2023-11-20 | Disposition: A | Discharge status: Home / Self Care | Source: Ambulatory Visit | Attending: Obstetrics and Gynecology | Admitting: Obstetrics and Gynecology

## 2023-11-20 ENCOUNTER — Ambulatory Visit: Admitting: Obstetrics and Gynecology

## 2023-11-20 ENCOUNTER — Encounter: Payer: Self-pay | Admitting: Obstetrics and Gynecology

## 2023-11-20 VITALS — BP 112/73 | HR 94 | Ht 68.0 in | Wt 144.9 lb

## 2023-11-20 DIAGNOSIS — Z124 Encounter for screening for malignant neoplasm of cervix: Secondary | ICD-10-CM | POA: Insufficient documentation

## 2023-11-20 DIAGNOSIS — Z01419 Encounter for gynecological examination (general) (routine) without abnormal findings: Secondary | ICD-10-CM | POA: Diagnosis present

## 2023-11-20 DIAGNOSIS — Z1231 Encounter for screening mammogram for malignant neoplasm of breast: Secondary | ICD-10-CM

## 2023-11-20 NOTE — Progress Notes (Signed)
 HPI:      [name] is a 48 y.o. [id number] who LMP was Patient's last menstrual period was 11/07/2023.  Subjective:   She presents today for her annual examination.  She has no complaints.  She continues to experience monthly periods but they are some x 1 day long and sometimes for 5 days long.  They are not quite as exact as they used to be.  She denies hot flashes or night sweats. She recently underwent breast augmentation and seems to be doing well. Pap last year was ASCUS with negative HPV. Partner has a vasectomy for birth control    Hx: The following portions of the patient's history were reviewed and updated as appropriate:             She  has a past medical history of Allergy, Anxiety, Chronic constipation, Endometriosis, Fibrocystic breast, Ovarian cyst, Pelvic pain in female, and Right lower quadrant pain. She does not have any pertinent problems on file. She  has a past surgical history that includes Tonsillectomy and adenoidectomy; Wisdom tooth extraction; Breast biopsy (Left, 05/13/2019); Breast biopsy (Right, 05/13/2019); Breast lumpectomy with radioactive seed localization (Right, 07/23/2019); Breast surgery (Right, 07/23/2019); Colonoscopy with propofol  (N/A, 11/22/2022); Esophagogastroduodenoscopy (egd) with propofol  (N/A, 11/22/2022); and biopsy (11/22/2022). Her family history includes Brain cancer in her maternal grandfather; Drug abuse in her maternal aunt and maternal uncle; Endometriosis in her mother; Prostate cancer in her maternal grandfather; Stroke in her maternal grandfather. She  reports that she has never smoked. She has never used smokeless tobacco. She reports current alcohol use. She reports that she does not use drugs. She has a current medication list which includes the following prescription(s): linaclotide , minoxidil , spironolactone , cyclobenzaprine, oxycodone -acetaminophen , pantoprazole , promethazine , and selenium  sulfide. She is allergic to citrus,  shellfish allergy, tape, metrogel [metronidazole], and tylenol  with codeine #3 [acetaminophen -codeine].       Review of Systems:  Review of Systems  Constitutional: Denied constitutional symptoms, night sweats, recent illness, fatigue, fever, insomnia and weight loss.  Eyes: Denied eye symptoms, eye pain, photophobia, vision change and visual disturbance.  Ears/Nose/Throat/Neck: Denied ear, nose, throat or neck symptoms, hearing loss, nasal discharge, sinus congestion and sore throat.  Cardiovascular: Denied cardiovascular symptoms, arrhythmia, chest pain/pressure, edema, exercise intolerance, orthopnea and palpitations.  Respiratory: Denied pulmonary symptoms, asthma, pleuritic pain, productive sputum, cough, dyspnea and wheezing.  Gastrointestinal: Denied, gastro-esophageal reflux, melena, nausea and vomiting.  Genitourinary: Denied genitourinary symptoms including symptomatic vaginal discharge, pelvic relaxation issues, and urinary complaints.  Musculoskeletal: Denied musculoskeletal symptoms, stiffness, swelling, muscle weakness and myalgia.  Dermatologic: Denied dermatology symptoms, rash and scar.  Neurologic: Denied neurology symptoms, dizziness, headache, neck pain and syncope.  Psychiatric: Denied psychiatric symptoms, anxiety and depression.  Endocrine: Denied endocrine symptoms including hot flashes and night sweats.   Meds:   Current Outpatient Medications on File Prior to Visit  Medication Sig Dispense Refill   linaclotide  (LINZESS ) 290 MCG CAPS capsule Take 1 capsule (290 mcg total) by mouth daily before breakfast. 90 capsule 1   minoxidil  (LONITEN ) 2.5 MG tablet Take 1 tablet (2.5 mg total) by mouth daily. 30 tablet 6   spironolactone  (ALDACTONE ) 100 MG tablet Take 1 tablet (100 mg total) by mouth daily. 30 tablet 11   cyclobenzaprine (FLEXERIL) 10 MG tablet Take 10 mg by mouth 3 (three) times daily as needed. (Patient not taking: Reported on 11/20/2023)      oxyCODONE -acetaminophen  (PERCOCET) 7.5-325 MG tablet Take 1 tablet by mouth every 6 (six) hours  as needed. (Patient not taking: Reported on 11/20/2023)     pantoprazole  (PROTONIX ) 40 MG tablet Take 1 tablet (40 mg total) by mouth daily. (Patient not taking: Reported on 11/20/2023) 90 tablet 3   promethazine  (PHENERGAN ) 25 MG tablet Take 25 mg by mouth every 4 (four) hours as needed. (Patient not taking: Reported on 11/20/2023)     selenium  sulfide (SELSUN ) 2.5 % lotion Apply 1 Application topically 3 (three) times a week. Apply to back 3 times weekly, let sit 5 minutes and rinse off, for Tinea Versicolor (Patient not taking: Reported on 11/20/2023) 118 mL 12   No current facility-administered medications on file prior to visit.     Objective:     Vitals:   11/20/23 0932  BP: 112/73  Pulse: 94    Filed Weights   11/20/23 0932  Weight: 144 lb 14.4 oz (65.7 kg)              Physical examination General NAD, Conversant  HEENT Atraumatic; Op clear with mmm.  Normo-cephalic.  Anicteric sclerae  Thyroid /Neck Smooth without nodularity or enlargement. Normal ROM.  Neck Supple.  Skin No rashes, lesions or ulceration. Normal palpated skin turgor. No nodularity.  Breasts: No masses or discharge.  Symmetric.  No axillary adenopathy.  Bilateral augmentation  Lungs: Clear to auscultation.No rales or wheezes. Normal Respiratory effort, no retractions.  Heart: NSR.  No murmurs or rubs appreciated. No peripheral edema  Abdomen: Soft.  Non-tender.  No masses.  No HSM. No hernia  Extremities: Moves all appropriately.  Normal ROM for age. No lymphadenopathy.  Neuro: Oriented to PPT.  Normal mood. Normal affect.     Pelvic:   Vulva: Normal appearance.  No lesions.  Vagina: No lesions or abnormalities noted.  Support: Normal pelvic support.  Urethra No masses tenderness or scarring.  Meatus Normal size without lesions or prolapse.  Cervix: Normal appearance.  No lesions.  Anus: Normal exam.  No  lesions.  Perineum: Normal exam.  No lesions.        Bimanual   Uterus: Normal size.  Non-tender.  Mobile.  AV.  Adnexae: No masses.  Non-tender to palpation.  Cul-de-sac: Negative for abnormality.     Assessment:    [id number] Patient Active Problem List   Diagnosis Date Noted   Hair thinning 03/12/2023   Mass of lower outer quadrant of right breast 02/04/2023   Chronic constipation 02/11/2022   GERD (gastroesophageal reflux disease) 03/20/2015     1. Well woman exam with routine gynecological exam   2. Cervical cancer screening   3. Screening mammogram for breast cancer     Normal exam   Plan:            1.  Basic Screening Recommendations The basic screening recommendations for asymptomatic women were discussed with the patient during her visit.  The age-appropriate recommendations were discussed with her and the rational for the tests reviewed.  When I am informed by the patient that another primary care physician has previously obtained the age-appropriate tests and they are up-to-date, only outstanding tests are ordered and referrals given as necessary.  Abnormal results of tests will be discussed with her when all of her results are completed.  Routine preventative health maintenance measures emphasized: Exercise/Diet/Weight control, Tobacco Warnings, Alcohol/Substance use risks and Stress Management Pap performed -  Orders Orders Placed This Encounter  Procedures   MM DIGITAL SCREENING BILATERAL    No orders of the defined types were placed in this encounter.  F/U  Return in about 1 year (around 11/19/2024) for Annual Physical.  Alm DOROTHA Sar, M.D. 11/20/2023 9:53 AM

## 2023-11-20 NOTE — Progress Notes (Signed)
 Patients presents for annual exam today. She states doing well, recent breast augmentation surgery in May. Due for pap smear and mammogram, ordered. Annual labs are up to date. She states no other questions or concerns at this time.

## 2023-11-28 LAB — CYTOLOGY - PAP
Comment: NEGATIVE
Diagnosis: NEGATIVE
Diagnosis: REACTIVE
High risk HPV: NEGATIVE

## 2024-03-01 LAB — COMPREHENSIVE METABOLIC PANEL WITH GFR
AG Ratio: 1.7 (calc) (ref 1.0–2.5)
ALT: 23 U/L (ref 6–29)
AST: 29 U/L (ref 10–35)
Albumin: 4.4 g/dL (ref 3.6–5.1)
Alkaline phosphatase (APISO): 35 U/L (ref 31–125)
BUN: 17 mg/dL (ref 7–25)
CO2: 27 mmol/L (ref 20–32)
Calcium: 9.7 mg/dL (ref 8.6–10.2)
Chloride: 99 mmol/L (ref 98–110)
Creat: 0.97 mg/dL (ref 0.50–0.99)
Globulin: 2.6 g/dL (ref 1.9–3.7)
Glucose, Bld: 75 mg/dL (ref 65–139)
Potassium: 3.7 mmol/L (ref 3.5–5.3)
Sodium: 136 mmol/L (ref 135–146)
Total Bilirubin: 0.7 mg/dL (ref 0.2–1.2)
Total Protein: 7 g/dL (ref 6.1–8.1)
eGFR: 73 mL/min/1.73m2 (ref >=60)

## 2024-03-01 LAB — CBC
HCT: 43.9 % (ref 35.0–45.0)
Hemoglobin: 14.7 g/dL (ref 11.7–15.5)
MCH: 31.1 pg (ref 27.0–33.0)
MCHC: 33.5 g/dL (ref 32.0–36.0)
MCV: 93 fL (ref 80.0–100.0)
MPV: 9.6 fL (ref 7.5–12.5)
Platelets: 246 Thousand/uL (ref 140–400)
RBC: 4.72 Million/uL (ref 3.80–5.10)
RDW: 11.9 % (ref 11.0–15.0)
WBC: 5.3 Thousand/uL (ref 3.8–10.8)

## 2024-03-01 LAB — LIPID PANEL
Cholesterol: 142 mg/dL (ref <200)
HDL: 42 mg/dL — ABNORMAL LOW (ref >=50)
LDL Cholesterol (Calc): 82 mg/dL
Non-HDL Cholesterol (Calc): 100 mg/dL (ref <130)
Total CHOL/HDL Ratio: 3.4 (calc) (ref <5.0)
Triglycerides: 90 mg/dL (ref <150)

## 2024-03-01 LAB — HEMOGLOBIN A1C
Hgb A1c MFr Bld: 5.3 % (ref <5.7)
Mean Plasma Glucose: 105 mg/dL
eAG (mmol/L): 5.8 mmol/L

## 2024-03-11 ENCOUNTER — Ambulatory Visit: Admitting: Internal Medicine

## 2024-03-11 VITALS — BP 102/68 | Ht 68.0 in | Wt 145.8 lb

## 2024-03-11 DIAGNOSIS — L659 Nonscarring hair loss, unspecified: Secondary | ICD-10-CM | POA: Diagnosis not present

## 2024-03-11 DIAGNOSIS — R1031 Right lower quadrant pain: Secondary | ICD-10-CM

## 2024-03-11 DIAGNOSIS — K5909 Other constipation: Secondary | ICD-10-CM | POA: Diagnosis not present

## 2024-03-11 DIAGNOSIS — K219 Gastro-esophageal reflux disease without esophagitis: Secondary | ICD-10-CM | POA: Diagnosis not present

## 2024-03-11 DIAGNOSIS — G8929 Other chronic pain: Secondary | ICD-10-CM | POA: Insufficient documentation

## 2024-03-11 MED ORDER — LINACLOTIDE 290 MCG PO CAPS: 290.0000 ug | ORAL_CAPSULE | Freq: Every day | ORAL | 1 refills | Status: AC

## 2024-03-11 NOTE — Progress Notes (Signed)
 Subjective:    Patient ID: Caitlin Castillo, female    DOB: 06-Oct-1975, 48 y.o.   MRN: [id number]  HPI  Patient presents to clinic today for follow-up of chronic conditions.  Chronic constipation: She has a BM almost daily on linaclotide .  She follows with GI.  Colonoscopy from 10/2022 reviewed.  Hair thinning: Managed on minoxidil  and spironolactone . She follows with dermatology.  GERD: Triggered by most things she eats although she reports this has improved with weight loss.  She takes pantoprazole  only as needed but has not taken this lately.  She uses gaviscon intermittently.  Upper GI from 10/2022 reviewed.  Chronic RLQ pain: Does seem worse around the time of her period or when she is feeling constipated. She has been evaluated in the past by GYN. US  from 06/2021 reviewed. CT abd/pelvis from 12/2017 reviewed.  Review of Systems     Past Medical History:  Diagnosis Date   Allergy    Anxiety    mild   Chronic constipation    Endometriosis    Fibrocystic breast    Ovarian cyst    Pelvic pain in female    Right lower quadrant pain     Current Outpatient Medications  Medication Sig Dispense Refill   cyclobenzaprine (FLEXERIL) 10 MG tablet Take 10 mg by mouth 3 (three) times daily as needed. (Patient not taking: Reported on 11/20/2023)     linaclotide  (LINZESS ) 290 MCG CAPS capsule Take 1 capsule (290 mcg total) by mouth daily before breakfast. 90 capsule 1   minoxidil  (LONITEN ) 2.5 MG tablet Take 1 tablet (2.5 mg total) by mouth daily. 30 tablet 6   oxyCODONE -acetaminophen  (PERCOCET) 7.5-325 MG tablet Take 1 tablet by mouth every 6 (six) hours as needed. (Patient not taking: Reported on 11/20/2023)     pantoprazole  (PROTONIX ) 40 MG tablet Take 1 tablet (40 mg total) by mouth daily. (Patient not taking: Reported on 11/20/2023) 90 tablet 3   promethazine  (PHENERGAN ) 25 MG tablet Take 25 mg by mouth every 4 (four) hours as needed. (Patient not taking: Reported on 11/20/2023)      selenium  sulfide (SELSUN ) 2.5 % lotion Apply 1 Application topically 3 (three) times a week. Apply to back 3 times weekly, let sit 5 minutes and rinse off, for Tinea Versicolor (Patient not taking: Reported on 11/20/2023) 118 mL 12   spironolactone  (ALDACTONE ) 100 MG tablet Take 1 tablet (100 mg total) by mouth daily. 30 tablet 11   No current facility-administered medications for this visit.    Allergies  Allergen Reactions   Citrus    Shellfish Allergy    Tape    Metrogel [Metronidazole] Rash    ??    Tylenol  With Codeine #3 [Acetaminophen -Codeine] Rash    Family History  Problem Relation Age of Onset   Endometriosis Mother    Prostate cancer Maternal Grandfather    Brain cancer Maternal Grandfather    Stroke Maternal Grandfather    Drug abuse Maternal Aunt    Drug abuse Maternal Uncle    Colon cancer Neg Hx    Ovarian cancer Neg Hx    Cancer Neg Hx    Diabetes Neg Hx    Heart disease Neg Hx    Breast cancer Neg Hx     Social History   Socioeconomic History   Marital status: Married    Spouse name: Not on file   Number of children: Not on file   Years of education: Not on file   Highest education  level: Not on file  Occupational History   Not on file  Tobacco Use   Smoking status: Never   Smokeless tobacco: Never  Vaping Use   Vaping status: Never Used  Substance and Sexual Activity   Alcohol use: Yes    Alcohol/week: 0.0 standard drinks of alcohol    Comment: occasional   Drug use: No   Sexual activity: Yes    Birth control/protection: Surgical    Comment: vasctomy  Other Topics Concern   Not on file  Social History Narrative   ** Merged History Encounter **       Social Drivers of Health   Financial Resource Strain: Not on file  Food Insecurity: Not on file  Transportation Needs: Not on file  Physical Activity: Insufficiently Active (07/07/2017)   Exercise Vital Sign    Days of Exercise per Week: 2 days    Minutes of Exercise per Session: 30 min   Stress: Not on file  Social Connections: Not on file  Intimate Partner Violence: Not on file     Constitutional: Denies fever, malaise, fatigue, headache or abrupt weight changes.  HEENT: Pt reports thinning hair. Denies eye pain, eye redness, ear pain, ringing in the ears, wax buildup, runny nose, nasal congestion, bloody nose, or sore throat. Respiratory: Denies difficulty breathing, shortness of breath, cough or sputum production.   Cardiovascular: Denies chest pain, chest tightness, palpitations or swelling in the hands or feet.  Gastrointestinal: Pt reports chronic constipation, intermittent reflux, chronic RLQ abdominal pain. Denies bloating, constipation, diarrhea or blood in the stool.  GU: Denies urgency, frequency, pain with urination, burning sensation, blood in urine, odor or discharge. Musculoskeletal: Denies decrease in range of motion, difficulty with gait, muscle pain or joint pain and swelling.  Skin: Denies redness, rashes, lesions or ulcercations.  Neurological: Denies dizziness, difficulty with memory, difficulty with speech or problems with balance and coordination.  Psych: Denies anxiety, depression, SI/HI.  No other specific complaints in a complete review of systems (except as listed in HPI above).  Objective:   Physical Exam  BP 102/68 (BP Location: Left Arm, Patient Position: Sitting, Cuff Size: Normal)   Ht 5' 8 (1.727 m)   Wt 145 lb 12.8 oz (66.1 kg)   LMP 03/07/2024 (Approximate)   BMI 22.17 kg/m   Wt Readings from Last 3 Encounters:  11/20/23 144 lb 14.4 oz (65.7 kg)  09/25/23 151 lb (68.5 kg)  03/12/23 173 lb 12.8 oz (78.8 kg)    General: Appears her stated age, in NAD. Cardiovascular: Normal rate and rhythm.  Pulmonary/Chest: Normal effort. No respiratory distress.  Musculoskeletal:  No difficulty with gait.  Neurological: Alert and oriented. Coordination normal.  Psychiatric: Mood and affect normal. Behavior is normal. Judgment and thought  content normal.     BMET    Component Value Date/Time   NA 136 09/25/2023 0844   NA 136 07/18/2021 1119   K 3.7 09/25/2023 0844   CL 99 09/25/2023 0844   CO2 27 09/25/2023 0844   GLUCOSE 75 09/25/2023 0844   BUN 17 09/25/2023 0844   BUN 12 07/18/2021 1119   CREATININE 0.97 09/25/2023 0844   CALCIUM 9.7 09/25/2023 0844    Lipid Panel     Component Value Date/Time   CHOL 142 09/25/2023 0844   CHOL 146 07/18/2021 1119   TRIG 90 09/25/2023 0844   HDL 42 (L) 09/25/2023 0844   HDL 67 07/18/2021 1119   CHOLHDL 3.4 09/25/2023 0844   LDLCALC 82 09/25/2023  0844    CBC    Component Value Date/Time   WBC 5.3 09/25/2023 0844   RBC 4.72 09/25/2023 0844   HGB 14.7 09/25/2023 0844   HGB 14.2 07/18/2021 1119   HCT 43.9 09/25/2023 0844   HCT 41.6 07/18/2021 1119   PLT 246 09/25/2023 0844   PLT 280 07/18/2021 1119   MCV 93.0 09/25/2023 0844   MCV 89 07/18/2021 1119   MCV 82 02/14/2012 0359   MCH 31.1 09/25/2023 0844   MCHC 33.5 09/25/2023 0844   RDW 11.9 09/25/2023 0844   RDW 12.2 07/18/2021 1119   RDW 13.9 02/14/2012 0359   LYMPHSABS 2.3 02/14/2012 0359   MONOABS 0.6 02/14/2012 0359   EOSABS 0.0 02/14/2012 0359   BASOSABS 0.0 02/14/2012 0359    Hgb A1C Lab Results  Component Value Date   HGBA1C 5.3 09/25/2023           Assessment & Plan:    RTC in 6 months for your annual exam

## 2024-03-11 NOTE — Assessment & Plan Note (Signed)
 Constipation versus endometriosis She is not interested in hormonal therapy at this time Consider pelvic/transvaginal ultrasound when pain flares for further evaluation

## 2024-03-11 NOTE — Patient Instructions (Signed)
 GERD in Adults: Diet Changes When you have gastroesophageal reflux disease (GERD), you may need to make changes to your diet. Choosing the right foods can help with your symptoms. Think about working with an expert in healthy eating called a dietitian. They can help you make healthy food choices. What are tips for following this plan? Reading food labels Look for foods that are low in saturated fat. Foods that may help with your symptoms include: Foods with less than 5% of daily value (DV) of fat. Foods with 0 grams of trans fat. Cooking [employer] in ways that don't use a lot of fat. These ways include: Baking. Steaming. Grilling. Broiling. To add flavor, try to use herbs that are low in spice and acidity. Avoid frying your food. Meal planning  Eat small meals often rather than eating 3 large meals each day. Eat your meals slowly in a place where you feel relaxed. If told by your health care provider, avoid: Foods that cause symptoms. Keep a food diary to keep track of foods that cause symptoms. Alcohol. Drinking a lot of liquid with meals. General instructions For 2-3 hours after you eat, avoid: Bending over. Exercise. Lying down. Chew sugar-free gum after meals. What foods should I eat? Eat a healthy diet. Try to include: Foods with high amounts of fiber. These include: Fruits and vegetables. Whole grains and beans. Low-fat dairy products. Lean meats, fish, and poultry. Egg whites. Foods that cause symptoms in someone else may not cause symptoms for you. Work with your provider to find foods that are safe for you. The items listed above may not be all the foods and drinks you can have. Talk with a dietitian to learn more. The items listed above may not be a complete list of foods and beverages you can eat and drink. Contact a dietitian for more information. What foods should I avoid? Limiting some of these foods may help with your symptoms. Each person is different.  Talk with a dietitian or your provider to help you find the exact foods to avoid. Some of the foods to avoid may include: Fruits Fruits with a lot of acid in them. These may include citrus fruits, such as oranges, grapefruit, pineapple, and lemons. Vegetables Deep-fried vegetables, such as [place] fries. Vegetables, sauces, or toppings made with added fat and vegetables with acid in them. These may include tomatoes and tomato products, chili peppers, onions, garlic, and horseradish. Grains Pastries or quick breads with added fat. Meats and other proteins High-fat meats, such as fatty beef or pork, hot dogs, ribs, ham, sausage, salami, and bacon. Fried meat or protein, such as fried fish and fried chicken. Egg yolks. Fats and oils Butter. Margarine. Shortening. Ghee. Drinks Coffee and other drinks with caffeine in them. Fizzy and sugary drinks, such as soda and energy drinks. Fruit juice made with acidic fruits, such as orange or grapefruit. Tomato juice. Sweets and desserts Chocolate and cocoa. Donuts. Seasonings and condiments Mint, such as peppermint and spearmint. Condiments, herbs, or seasonings that cause symptoms. These may include curry, hot sauce, or vinegar-based salad dressings. The items listed above may not be all the foods and drinks you should avoid. Talk with a dietitian to learn more. Questions to ask your health care provider Changes to your diet and everyday life are often the first steps taken to manage symptoms of GERD. If these changes don't help, talk with your provider about taking medicines. Where to find more information [employer]:  aboutgerd.org This information is not intended to replace advice given to you by your health care provider. Make sure you discuss any questions you have with your health care provider. Document Revised: 02/25/2023 Document Reviewed: 09/11/2022 Elsevier Patient Education  2024 [employer].

## 2024-03-11 NOTE — Assessment & Plan Note (Signed)
 Continue minoxidil  2.5 mg and spironolactone  100 mg daily per dermatology

## 2024-03-11 NOTE — Assessment & Plan Note (Signed)
 Try to identify and avoid foods that trigger reflux Continue gaviscon OTC as needed

## 2024-03-11 NOTE — Assessment & Plan Note (Signed)
 Encouraged high-fiber diet and adequate water intake Continue linaclotide  290 mcg daily as needed

## 2024-03-17 ENCOUNTER — Ambulatory Visit: Admitting: Internal Medicine

## 2024-09-28 ENCOUNTER — Encounter: Admitting: Internal Medicine

## 2024-11-08 ENCOUNTER — Encounter: Admitting: Dermatology
